# Patient Record
Sex: Female | Born: 2000 | Hispanic: No | Marital: Single | Smoking: Never smoker
Health system: Southern US, Community
[De-identification: ages and names within clinical notes are randomized; demographics above are authoritative.]

## PROBLEM LIST (undated history)

## (undated) DIAGNOSIS — Z8742 Personal history of other diseases of the female genital tract: Secondary | ICD-10-CM

## (undated) DIAGNOSIS — J45909 Unspecified asthma, uncomplicated: Secondary | ICD-10-CM

## (undated) HISTORY — PX: OTHER SURGICAL HISTORY: SHX169

## (undated) HISTORY — DX: Personal history of other diseases of the female genital tract: Z87.42

---
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
## 2021-01-01 ENCOUNTER — Other Ambulatory Visit: Payer: Self-pay | Admitting: Obstetrics & Gynecology

## 2021-03-07 ENCOUNTER — Emergency Department: Payer: Medicaid Other

## 2021-03-07 ENCOUNTER — Encounter: Payer: Self-pay | Admitting: Emergency Medicine

## 2021-03-07 ENCOUNTER — Other Ambulatory Visit: Payer: Self-pay

## 2021-03-07 ENCOUNTER — Emergency Department
Admission: EM | Admit: 2021-03-07 | Discharge: 2021-03-07 | Disposition: A | Discharge status: Home / Self Care | Payer: Medicaid Other | Attending: Emergency Medicine | Admitting: Emergency Medicine

## 2021-03-07 DIAGNOSIS — R0602 Shortness of breath: Secondary | ICD-10-CM | POA: Diagnosis present

## 2021-03-07 DIAGNOSIS — J45901 Unspecified asthma with (acute) exacerbation: Secondary | ICD-10-CM | POA: Diagnosis not present

## 2021-03-07 MED ORDER — PREDNISONE 20 MG PO TABS
60.0000 mg | ORAL_TABLET | Freq: Once | ORAL | Status: AC
  Administered 2021-03-07: 60 mg via ORAL
  Filled 2021-03-07: qty 3

## 2021-03-07 MED ORDER — IPRATROPIUM-ALBUTEROL 0.5-2.5 (3) MG/3ML IN SOLN
3.0000 mL | Freq: Once | RESPIRATORY_TRACT | Status: AC
  Administered 2021-03-07: 3 mL via RESPIRATORY_TRACT
  Filled 2021-03-07: qty 3

## 2021-03-07 MED ORDER — IPRATROPIUM-ALBUTEROL 0.5-2.5 (3) MG/3ML IN SOLN
6.0000 mL | Freq: Once | RESPIRATORY_TRACT | Status: AC
  Administered 2021-03-07: 6 mL via RESPIRATORY_TRACT

## 2021-03-07 MED ORDER — PREDNISONE 10 MG PO TABS: 10.0000 mg | ORAL_TABLET | Freq: Every day | ORAL | 0 refills | Status: DC

## 2021-03-07 MED ORDER — IPRATROPIUM-ALBUTEROL 0.5-2.5 (3) MG/3ML IN SOLN
RESPIRATORY_TRACT | Status: AC
  Filled 2021-03-07: qty 6

## 2021-03-07 NOTE — ED Notes (Signed)
Signature pad not working in room. Pt signed physical discharge form and ambulated to lobby.

## 2021-03-07 NOTE — ED Triage Notes (Signed)
Neb completed.  Respirations regular and non labored.  Voice clear and strong.  NAD

## 2021-03-07 NOTE — ED Triage Notes (Signed)
C/O wheezing and asthma attack x 3 days.  Using inhalers with no effect.

## 2021-03-07 NOTE — ED Provider Notes (Signed)
[facility] Emergency Department Provider Note  Time seen: 1:56 PM  I have reviewed the triage vital signs and the nursing notes.   HISTORY  Chief Complaint Asthma   HPI Ronni A Gassner is a 20 y.o. female with a past medical history of asthma who presents emergency department for shortness of breath.  According to the patient over the past 3 days she has been experiencing shortness of breath and wheeze.  Has been using her albuterol inhaler without relief so she came to the emergency department.  Here patient continues to have moderate expiratory wheeze.  States slight cough which she relates to asthma denies any fever or congestion.  Has a secondary complaint patient states she is approximately 1 month late on her menstrual cycle.   Past Medical History:  Diagnosis Date   Asthma     Patient Active Problem List   Diagnosis Date Noted   Encounter for supervision of normal first pregnancy in first trimester 05/22/2020   History of asthma 12/29/2018   Oral contraception initiation 12/29/2018    History reviewed. No pertinent surgical history.  Prior to Admission medications   Medication Sig Start Date End Date Taking? Authorizing Provider  albuterol (VENTOLIN HFA) 108 (90 Base) MCG/ACT inhaler Inhale 2 puffs into the lungs every 6 (six) hours as needed for wheezing or shortness of breath. 03/17/20   Willy Eddy, MD  albuterol (VENTOLIN HFA) 108 (90 Base) MCG/ACT inhaler Inhale 2 puffs into the lungs every 6 (six) hours as needed for wheezing or shortness of breath. 03/17/20   Willy Eddy, MD  budesonide-formoterol [facility]) 80-4.5 MCG/ACT inhaler Inhale 2 puffs into the lungs in the morning and at bedtime. 03/17/20 04/16/20  Willy Eddy, MD  cetirizine (ZYRTEC) 10 MG tablet Take 1 tablet (10 mg total) by mouth daily. 02/02/18   Loleta Rose, MD    Allergies  Allergen Reactions   Shellfish Allergy Swelling    No family history on  file.  Social History Social History   Tobacco Use   Smoking status: Never   Smokeless tobacco: Never  Vaping Use   Vaping Use: Never used  Substance Use Topics   Alcohol use: Not Currently    Comment: rarely   Drug use: Never    Review of Systems Constitutional: Negative for fever. Cardiovascular: Negative for chest pain. Respiratory: Positive for shortness of breath and wheeze.  Slight cough. Gastrointestinal: Negative for abdominal pain Genitourinary: Negative for urinary compaints Musculoskeletal: Negative for musculoskeletal complaints Neurological: Negative for headache All other ROS negative  ____________________________________________   PHYSICAL EXAM:  VITAL SIGNS: ED Triage Vitals  Enc Vitals Group     BP 03/07/21 1258 125/85     Pulse Rate 03/07/21 1258 (!) 130     Resp 03/07/21 1258 (!) 25     Temp 03/07/21 1258 98.2 F (36.8 C)     Temp Source 03/07/21 1258 Oral     SpO2 03/07/21 1258 94 %     Weight 03/07/21 1253 138 lb 0.1 oz (62.6 kg)     Height 03/07/21 1253 5\' 4"  (1.626 m)     Head Circumference --      Peak Flow --      Pain Score 03/07/21 1253 0     Pain Loc --      Pain Edu? --      Excl. in GC? --    Constitutional: Alert and oriented. Well appearing and in no distress. Eyes: Normal exam ENT  Head: Normocephalic and atraumatic.      Mouth/Throat: Mucous membranes are moist. Cardiovascular: Normal rate, regular rhythm.  Respiratory: Normal respiratory rate with moderate expiratory wheeze bilaterally. Gastrointestinal: Soft and nontender. No distention.  Musculoskeletal: Nontender with normal range of motion in all extremities.  Neurologic:  Normal speech and language. No gross focal neurologic deficits Skin:  Skin is warm, dry and intact.  Psychiatric: Mood and affect are normal.    INITIAL IMPRESSION / ASSESSMENT AND PLAN / ED COURSE  Pertinent labs & imaging results that were available during my care of the patient were  reviewed by me and considered in my medical decision making (see chart for details).   Patient presents emergency department for asthma exacerbation over the past 3 days with moderate expiratory wheeze bilaterally.  No fever.  Patient appears well she is able to speak in full sentences.  We will dose several DuoNeb's, oral prednisone and reassess.  Patient agreeable to plan of care.  Patient's chest x-ray is consistent with reactive airway disease.  Patient's wheeze is much improved after duo nebs.  Patient states she is feeling much better and is ready to go home.  On repeat examination patient does have a slight expiratory wheeze.  Patient states he feels comfortable going home and if the shortness of breath worsens she will return to the emergency department.  Patient has albuterol to use at home.  We will discharge with a prednisone taper and have her follow-up with her doctor.  Magon A Kathan was evaluated in Emergency Department on 03/07/2021 for the symptoms described in the history of present illness. She was evaluated in the context of the global COVID-19 pandemic, which necessitated consideration that the patient might be at risk for infection with the SARS-CoV-2 virus that causes COVID-19. Institutional protocols and algorithms that pertain to the evaluation of patients at risk for COVID-19 are in a state of rapid change based on information released by regulatory bodies including the CDC and federal and state organizations. These policies and algorithms were followed during the patient's care in the ED.  ____________________________________________   FINAL CLINICAL IMPRESSION(S) / ED DIAGNOSES  Asthma exacerbation   Minna Antis, MD 03/07/21 1520

## 2021-04-29 ENCOUNTER — Other Ambulatory Visit: Payer: Self-pay

## 2021-04-29 ENCOUNTER — Encounter: Payer: Self-pay | Admitting: Emergency Medicine

## 2021-04-29 ENCOUNTER — Emergency Department
Admission: EM | Admit: 2021-04-29 | Discharge: 2021-04-29 | Disposition: A | Discharge status: Home / Self Care | Payer: Medicaid Other | Attending: Emergency Medicine | Admitting: Emergency Medicine

## 2021-04-29 DIAGNOSIS — Z20822 Contact with and (suspected) exposure to covid-19: Secondary | ICD-10-CM | POA: Diagnosis not present

## 2021-04-29 DIAGNOSIS — Z7951 Long term (current) use of inhaled steroids: Secondary | ICD-10-CM | POA: Insufficient documentation

## 2021-04-29 DIAGNOSIS — J4541 Moderate persistent asthma with (acute) exacerbation: Secondary | ICD-10-CM | POA: Diagnosis not present

## 2021-04-29 DIAGNOSIS — R062 Wheezing: Secondary | ICD-10-CM | POA: Diagnosis present

## 2021-04-29 LAB — RESP PANEL BY RT-PCR (FLU A&B, COVID) ARPGX2
Influenza A by PCR: NEGATIVE
Influenza B by PCR: NEGATIVE
SARS Coronavirus 2 by RT PCR: NEGATIVE

## 2021-04-29 MED ORDER — PREDNISONE 10 MG (21) PO TBPK: ORAL_TABLET | ORAL | 0 refills | Status: DC

## 2021-04-29 MED ORDER — IPRATROPIUM-ALBUTEROL 0.5-2.5 (3) MG/3ML IN SOLN
3.0000 mL | Freq: Once | RESPIRATORY_TRACT | Status: AC
  Administered 2021-04-29: 3 mL via RESPIRATORY_TRACT

## 2021-04-29 MED ORDER — PREDNISONE 20 MG PO TABS
60.0000 mg | ORAL_TABLET | Freq: Once | ORAL | Status: AC
  Administered 2021-04-29: 60 mg via ORAL

## 2021-04-29 MED ORDER — ALBUTEROL SULFATE HFA 108 (90 BASE) MCG/ACT IN AERS: 2.0000 | INHALATION_SPRAY | Freq: Four times a day (QID) | RESPIRATORY_TRACT | 2 refills | Status: DC | PRN

## 2021-04-29 MED ORDER — IPRATROPIUM-ALBUTEROL 0.5-2.5 (3) MG/3ML IN SOLN
6.0000 mL | Freq: Once | RESPIRATORY_TRACT | Status: AC
  Administered 2021-04-29: 6 mL via RESPIRATORY_TRACT

## 2021-04-29 NOTE — ED Triage Notes (Signed)
Pt arrived via POV with reports of asthma, pt states she ran out of her nebs yesterday and does not have any refills on rescue inhaler, audible wheezes heard in triage.  Pt reports cough prior to ashtma sxs. Denies any fever, no known sick contacts.

## 2021-04-29 NOTE — ED Provider Notes (Signed)
[facility]-EMERGENCY DEPARTMENT  ____________________________________________  Time seen: Approximately 10:53 PM  I have reviewed the triage vital signs and the nursing notes.   HISTORY  Chief Complaint Asthma   Historian Patient     HPI Laura Holder is a 20 y.o. female presents to the emergency department with wheezing.  Patient states that she has been out of her inhaler at home.  She has had some chest tightness.  Denies fever and chills.  No nausea, vomiting or abdominal pain.   Past Medical History:  Diagnosis Date   Asthma      Immunizations up to date:  Yes.     Past Medical History:  Diagnosis Date   Asthma     Patient Active Problem List   Diagnosis Date Noted   Encounter for supervision of normal first pregnancy in first trimester 05/22/2020   History of asthma 12/29/2018   Oral contraception initiation 12/29/2018    History reviewed. No pertinent surgical history.  Prior to Admission medications   Medication Sig Start Date End Date Taking? Authorizing Provider  predniSONE (STERAPRED UNI-PAK 21 TAB) 10 MG (21) TBPK tablet 6,5,4,3,2,1 04/29/21  Yes Joseph Art, Lockie Pares M, PA-C  albuterol (VENTOLIN HFA) 108 (90 Base) MCG/ACT inhaler Inhale 2 puffs into the lungs every 6 (six) hours as needed for wheezing or shortness of breath. 03/17/20   Willy Eddy, MD  albuterol (VENTOLIN HFA) 108 (90 Base) MCG/ACT inhaler Inhale 2 puffs into the lungs every 6 (six) hours as needed for wheezing or shortness of breath. 03/17/20   Willy Eddy, MD  budesonide-formoterol [facility]) 80-4.5 MCG/ACT inhaler Inhale 2 puffs into the lungs in the morning and at bedtime. 03/17/20 04/16/20  Willy Eddy, MD  cetirizine (ZYRTEC) 10 MG tablet Take 1 tablet (10 mg total) by mouth daily. 02/02/18   Loleta Rose, MD    Allergies Shellfish allergy  History reviewed. No pertinent family history.  Social History Social History   Tobacco Use   Smoking status: Never    Smokeless tobacco: Never  Vaping Use   Vaping Use: Never used  Substance Use Topics   Alcohol use: Not Currently    Comment: rarely   Drug use: Never     Review of Systems  Constitutional: No fever/chills Eyes:  No discharge ENT: No upper respiratory complaints. Respiratory: Patient has wheezing.  Gastrointestinal:   No nausea, no vomiting.  No diarrhea.  No constipation. Musculoskeletal: Negative for musculoskeletal pain. Skin: Negative for rash, abrasions, lacerations, ecchymosis.   ____________________________________________   PHYSICAL EXAM:  VITAL SIGNS: ED Triage Vitals  Enc Vitals Group     BP 04/29/21 1945 (!) 134/95     Pulse Rate 04/29/21 1945 (!) 101     Resp 04/29/21 1945 18     Temp 04/29/21 1945 98.9 F (37.2 C)     Temp Source 04/29/21 1945 Oral     SpO2 04/29/21 1945 92 %     Weight 04/29/21 1944 132 lb (59.9 kg)     Height 04/29/21 1944 5\' 5"  (1.651 m)     Head Circumference --      Peak Flow --      Pain Score 04/29/21 1944 0     Pain Loc --      Pain Edu? --      Excl. in GC? --      Constitutional: Alert and oriented. Well appearing and in no acute distress. Eyes: Conjunctivae are normal. PERRL. EOMI. Head: Atraumatic. ENT:      Nose:  No congestion/rhinnorhea.      Mouth/Throat: Mucous membranes are moist.  Neck: No stridor.  No cervical spine tenderness to palpation. Cardiovascular: Normal rate, regular rhythm. Normal S1 and S2.  Good peripheral circulation. Respiratory: Normal respiratory effort without tachypnea or retractions.  Patient has diffuse expiratory wheezing auscultated bilaterally.  Good air entry to the bases with no decreased or absent breath sounds Gastrointestinal: Bowel sounds x 4 quadrants. Soft and nontender to palpation. No guarding or rigidity. No distention. Musculoskeletal: Full range of motion to all extremities. No obvious deformities noted Neurologic:  Normal for age. No gross focal neurologic deficits are  appreciated.  Skin:  Skin is warm, dry and intact. No rash noted. Psychiatric: Mood and affect are normal for age. Speech and behavior are normal.   ____________________________________________   LABS (all labs ordered are listed, but only abnormal results are displayed)  Labs Reviewed  RESP PANEL BY RT-PCR (FLU A&B, COVID) [id number]   ____________________________________________  EKG   ____________________________________________  RADIOLOGY   No results found.  ____________________________________________    PROCEDURES  Procedure(s) performed:     Procedures     Medications  ipratropium-albuterol (DUONEB) 0.5-2.5 (3) MG/3ML nebulizer solution 6 mL (6 mLs Nebulization Given 04/29/21 1957)  ipratropium-albuterol (DUONEB) 0.5-2.5 (3) MG/3ML nebulizer solution 3 mL (3 mLs Nebulization Given 04/29/21 2201)  ipratropium-albuterol (DUONEB) 0.5-2.5 (3) MG/3ML nebulizer solution 3 mL (3 mLs Nebulization Given 04/29/21 2249)  predniSONE (DELTASONE) tablet 60 mg (60 mg Oral Given 04/29/21 2247)     ____________________________________________   INITIAL IMPRESSION / ASSESSMENT AND PLAN / ED COURSE  Pertinent labs & imaging results that were available during my care of the patient were reviewed by me and considered in my medical decision making (see chart for details).      Assessment and plan Wheezing 20 year old female presents to the emergency department with wheezing and chest tightness for the past 24 hours.  Vital signs are reassuring at triage.  On physical exam, patient was alert, active and nontoxic-appearing with diffuse wheezing auscultated bilaterally.  Wheezing did improve with DuoNeb.  She was started on first dose of prednisone in the emergency department and discharged with tapered prednisone.  Return precautions were given to return with new or worsening symptoms.  All patient questions were  answered.     ____________________________________________  FINAL CLINICAL IMPRESSION(S) / ED DIAGNOSES  Final diagnoses:  Moderate persistent asthma with exacerbation      NEW MEDICATIONS STARTED DURING THIS VISIT:  ED Discharge Orders          Ordered    predniSONE (STERAPRED UNI-PAK 21 TAB) 10 MG (21) TBPK tablet        04/29/21 2239                This chart was dictated using voice recognition software/Dragon. Despite best efforts to proofread, errors can occur which can change the meaning. Any change was purely unintentional.     Orvil Feil, PA-C 04/29/21 2256    Delton Prairie, MD 05/02/21 [phone]

## 2021-04-29 NOTE — ED Notes (Signed)
Pt improved after neb

## 2021-08-09 ENCOUNTER — Emergency Department: Payer: No Typology Code available for payment source

## 2021-08-09 ENCOUNTER — Other Ambulatory Visit: Payer: Self-pay

## 2021-08-09 ENCOUNTER — Emergency Department
Admission: EM | Admit: 2021-08-09 | Discharge: 2021-08-09 | Disposition: A | Discharge status: Home / Self Care | Payer: No Typology Code available for payment source | Attending: Emergency Medicine | Admitting: Emergency Medicine

## 2021-08-09 DIAGNOSIS — Y92410 Unspecified street and highway as the place of occurrence of the external cause: Secondary | ICD-10-CM | POA: Diagnosis not present

## 2021-08-09 DIAGNOSIS — S0003XA Contusion of scalp, initial encounter: Secondary | ICD-10-CM | POA: Diagnosis not present

## 2021-08-09 DIAGNOSIS — M542 Cervicalgia: Secondary | ICD-10-CM | POA: Insufficient documentation

## 2021-08-09 DIAGNOSIS — S0990XA Unspecified injury of head, initial encounter: Secondary | ICD-10-CM | POA: Diagnosis present

## 2021-08-09 LAB — POC URINE PREG, ED: Preg Test, Ur: NEGATIVE

## 2021-08-09 MED ORDER — METHOCARBAMOL 500 MG PO TABS: 500.0000 mg | ORAL_TABLET | Freq: Four times a day (QID) | ORAL | 0 refills | Status: DC

## 2021-08-09 MED ORDER — MELOXICAM 15 MG PO TABS: 15.0000 mg | ORAL_TABLET | Freq: Every day | ORAL | 0 refills | Status: DC
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
## 2021-11-01 ENCOUNTER — Encounter: Payer: Self-pay | Admitting: Internal Medicine

## 2021-11-01 ENCOUNTER — Other Ambulatory Visit: Payer: Self-pay

## 2021-11-01 ENCOUNTER — Observation Stay
Admission: EM | Admit: 2021-11-01 | Discharge: 2021-11-02 | Disposition: A | Discharge status: Home / Self Care | Payer: Medicaid Other | Attending: Student | Admitting: Student

## 2021-11-01 ENCOUNTER — Emergency Department: Payer: Medicaid Other

## 2021-11-01 DIAGNOSIS — J45901 Unspecified asthma with (acute) exacerbation: Principal | ICD-10-CM | POA: Insufficient documentation

## 2021-11-01 DIAGNOSIS — R0602 Shortness of breath: Secondary | ICD-10-CM | POA: Diagnosis present

## 2021-11-01 DIAGNOSIS — J4551 Severe persistent asthma with (acute) exacerbation: Secondary | ICD-10-CM

## 2021-11-01 DIAGNOSIS — Z20822 Contact with and (suspected) exposure to covid-19: Secondary | ICD-10-CM | POA: Diagnosis not present

## 2021-11-01 DIAGNOSIS — J9601 Acute respiratory failure with hypoxia: Secondary | ICD-10-CM | POA: Diagnosis not present

## 2021-11-01 DIAGNOSIS — Z79899 Other long term (current) drug therapy: Secondary | ICD-10-CM | POA: Diagnosis not present

## 2021-11-01 DIAGNOSIS — R000 Tachycardia, unspecified: Secondary | ICD-10-CM | POA: Insufficient documentation

## 2021-11-01 LAB — CBC WITH DIFFERENTIAL/PLATELET
Abs Immature Granulocytes: 0.02 10*3/uL (ref 0.00–0.07)
Basophils Absolute: 0.1 10*3/uL (ref 0.0–0.1)
Basophils Relative: 1 %
Eosinophils Absolute: 0.8 10*3/uL — ABNORMAL HIGH (ref 0.0–0.5)
Eosinophils Relative: 9 %
HCT: 48.7 % — ABNORMAL HIGH (ref 36.0–46.0)
Hemoglobin: 15.8 g/dL — ABNORMAL HIGH (ref 12.0–15.0)
Immature Granulocytes: 0 %
Lymphocytes Relative: 36 %
Lymphs Abs: 3.3 10*3/uL (ref 0.7–4.0)
MCH: 28.3 pg (ref 26.0–34.0)
MCHC: 32.4 g/dL (ref 30.0–36.0)
MCV: 87.3 fL (ref 80.0–100.0)
Monocytes Absolute: 0.9 10*3/uL (ref 0.1–1.0)
Monocytes Relative: 10 %
Neutro Abs: 4.2 10*3/uL (ref 1.7–7.7)
Neutrophils Relative %: 44 %
Platelets: 398 10*3/uL (ref 150–400)
RBC: 5.58 MIL/uL — ABNORMAL HIGH (ref 3.87–5.11)
RDW: 13.5 % (ref 11.5–15.5)
WBC: 9.3 10*3/uL (ref 4.0–10.5)
nRBC: 0 % (ref 0.0–0.2)

## 2021-11-01 LAB — BASIC METABOLIC PANEL
Anion gap: 8 (ref 5–15)
BUN: 7 mg/dL (ref 6–20)
CO2: 23 mmol/L (ref 22–32)
Calcium: 9.2 mg/dL (ref 8.9–10.3)
Chloride: 108 mmol/L (ref 98–111)
Creatinine, Ser: 0.65 mg/dL (ref 0.44–1.00)
GFR, Estimated: >60 mL/min (ref >60)
Glucose, Bld: 103 mg/dL — ABNORMAL HIGH (ref 70–99)
Potassium: 3.6 mmol/L (ref 3.5–5.1)
Sodium: 139 mmol/L (ref 135–145)

## 2021-11-01 LAB — SARS CORONAVIRUS 2 BY RT PCR: SARS Coronavirus 2 by RT PCR: NEGATIVE

## 2021-11-01 LAB — TROPONIN I (HIGH SENSITIVITY): Troponin I (High Sensitivity): <2 ng/L (ref <18)

## 2021-11-01 MED ORDER — SODIUM CHLORIDE 0.9 % IV BOLUS
1500.0000 mL | Freq: Once | INTRAVENOUS | Status: AC
  Administered 2021-11-01: 1500 mL via INTRAVENOUS

## 2021-11-01 MED ORDER — ONDANSETRON HCL 4 MG/2ML IJ SOLN: 4.0000 mg | Freq: Three times a day (TID) | INTRAMUSCULAR | Status: DC | PRN

## 2021-11-01 MED ORDER — MAGNESIUM SULFATE 2 GM/50ML IV SOLN
INTRAVENOUS | Status: AC
  Administered 2021-11-01: 2 g
  Filled 2021-11-01: qty 50

## 2021-11-01 MED ORDER — DM-GUAIFENESIN ER 30-600 MG PO TB12: 1.0000 | ORAL_TABLET | Freq: Two times a day (BID) | ORAL | Status: DC | PRN

## 2021-11-01 MED ORDER — IPRATROPIUM-ALBUTEROL 0.5-2.5 (3) MG/3ML IN SOLN: 9.0000 mL | Freq: Once | RESPIRATORY_TRACT | Status: AC

## 2021-11-01 MED ORDER — ORAL CARE MOUTH RINSE: 15.0000 mL | Freq: Two times a day (BID) | OROMUCOSAL | Status: DC

## 2021-11-01 MED ORDER — ENOXAPARIN SODIUM 40 MG/0.4ML IJ SOSY
40.0000 mg | PREFILLED_SYRINGE | INTRAMUSCULAR | Status: DC
  Administered 2021-11-01: 40 mg via SUBCUTANEOUS
  Filled 2021-11-01: qty 0.4

## 2021-11-01 MED ORDER — IPRATROPIUM-ALBUTEROL 0.5-2.5 (3) MG/3ML IN SOLN
RESPIRATORY_TRACT | Status: AC
  Administered 2021-11-01: 9 mL via RESPIRATORY_TRACT
  Filled 2021-11-01: qty 9

## 2021-11-01 MED ORDER — IPRATROPIUM BROMIDE HFA 17 MCG/ACT IN AERS
2.0000 | INHALATION_SPRAY | RESPIRATORY_TRACT | Status: DC
Start: 2021-11-01 — End: 2021-11-02
  Administered 2021-11-01 (×4): 2 via RESPIRATORY_TRACT
  Filled 2021-11-01: qty 12.9

## 2021-11-01 MED ORDER — METHYLPREDNISOLONE SODIUM SUCC 125 MG IJ SOLR
80.0000 mg | INTRAMUSCULAR | Status: DC
  Administered 2021-11-01: 80 mg via INTRAVENOUS
  Filled 2021-11-01: qty 2

## 2021-11-01 MED ORDER — ACETAMINOPHEN 325 MG PO TABS: 650.0000 mg | ORAL_TABLET | Freq: Four times a day (QID) | ORAL | Status: DC | PRN

## 2021-11-01 MED ORDER — METHYLPREDNISOLONE SODIUM SUCC 125 MG IJ SOLR
INTRAMUSCULAR | Status: AC
  Administered 2021-11-01: 125 mg
  Filled 2021-11-01: qty 2

## 2021-11-01 MED ORDER — LEVALBUTEROL HCL 0.63 MG/3ML IN NEBU
0.6300 mg | INHALATION_SOLUTION | Freq: Four times a day (QID) | RESPIRATORY_TRACT | Status: DC | PRN
  Administered 2021-11-01: 0.63 mg via RESPIRATORY_TRACT
  Filled 2021-11-01: qty 3

## 2021-11-01 NOTE — ED Notes (Signed)
X-ray at bedside

## 2021-11-01 NOTE — Assessment & Plan Note (Deleted)
Acute respiratory failure with hypoxia due to asthma exacerbation: COVID PCR negative.  - will admit to progressive unit as inpatient -Bronchodilators -Patient received 2 g of magnesium sulfate in ED -Solu-Medrol 40 mg IV bid -Mucinex for cough  -check RVP -Nasal cannula oxygen as needed to maintain O2 saturation 93% or greater

## 2021-11-01 NOTE — Assessment & Plan Note (Addendum)
Desaturated to 89% on room air.  Respiratory distress improved. -Discharged on prednisone, Advair and as needed albuterol. -Full RVP panel pending

## 2021-11-01 NOTE — ED Notes (Signed)
ED Provider at bedside. 

## 2021-11-01 NOTE — ED Provider Notes (Signed)
[facility] Provider Note    Event Date/Time   First MD Initiated Contact with Patient 11/01/21 [phone]     (approximate)   History   Chief Complaint Shortness of Breath   HPI  Laura Holder is a 21 y.o. female with past medical history of asthma who presents to the ED complaining of shortness of breath.  Patient reports that she has been increasingly short of breath with a dry cough past 2 days.  She describes associated tightness and pain in her chest moving towards her back.  She has been using her inhaler at home with only partial relief.  She states her cough is nonproductive and she has not had any fevers.  She denies any pain or swelling in her legs.  She describes symptoms as similar to prior asthma exacerbations and she has required admission to the hospital before for asthma but has never been intubated.     Physical Exam   Triage Vital Signs: ED Triage Vitals  Enc Vitals Group     BP 11/01/21 0558 (!) 141/100     Pulse Rate 11/01/21 0558 (!) 152     Resp 11/01/21 0558 16     Temp 11/01/21 0558 99 F (37.2 C)     Temp Source 11/01/21 0558 Oral     SpO2 11/01/21 0558 92 %     Weight 11/01/21 0556 132 lb (59.9 kg)     Height 11/01/21 0556 5\' 6"  (1.676 m)     Head Circumference --      Peak Flow --      Pain Score 11/01/21 0555 5     Pain Loc --      Pain Edu? --      Excl. in GC? --     Most recent vital signs: Vitals:   11/01/21 0700 11/01/21 0718  BP: 129/81   Pulse: (!) 134 (!) 129  Resp: (!) 23   Temp:    SpO2: (!) 89% 90%    Constitutional: Alert and oriented. Eyes: Conjunctivae are normal. Head: Atraumatic. Nose: No congestion/rhinnorhea. Mouth/Throat: Mucous membranes are moist.  Cardiovascular: Tachycardic, regular rhythm. Grossly normal heart sounds.  2+ radial pulses bilaterally. Respiratory: Tachypneic with increased respiratory effort, inspiratory and expiratory wheezing noted throughout. Gastrointestinal: Soft and  nontender. No distention. Musculoskeletal: No lower extremity tenderness nor edema.  Neurologic:  Normal speech and language. No gross focal neurologic deficits are appreciated.    ED Results / Procedures / Treatments   Labs (all labs ordered are listed, but only abnormal results are displayed) Labs Reviewed  CBC WITH DIFFERENTIAL/PLATELET - Abnormal; Notable for the following components:      Result Value   RBC 5.58 (*)    Hemoglobin 15.8 (*)    HCT 48.7 (*)    Eosinophils Absolute 0.8 (*)    All other components within normal limits  BASIC METABOLIC PANEL - Abnormal; Notable for the following components:   Glucose, Bld 103 (*)    All other components within normal limits  SARS CORONAVIRUS 2 BY RT PCR  POC URINE PREG, ED  TROPONIN I (HIGH SENSITIVITY)     EKG  ED ECG REPORT I, 01/01/22, the attending physician, personally viewed and interpreted this ECG.   Date: 11/01/2021  EKG Time: 6:03  Rate: 149  Rhythm: sinus tachycardia  Axis: RAD  Intervals:none  ST&T Change: None  RADIOLOGY Chest x-ray reviewed and interpreted by me with no infiltrate, edema, or effusion.  PROCEDURES:  Critical Care performed: No  Procedures   MEDICATIONS ORDERED IN ED: Medications  methylPREDNISolone sodium succinate (SOLU-MEDROL) 125 mg/2 mL injection (125 mg  Given 11/01/21 0947)  magnesium sulfate 2 GM/50ML IVPB (0 g  Stopped 11/01/21 0707)  ipratropium-albuterol (DUONEB) 0.5-2.5 (3) MG/3ML nebulizer solution 9 mL (9 mLs Nebulization Given 11/01/21 0604)     IMPRESSION / MDM / ASSESSMENT AND PLAN / ED COURSE  I reviewed the triage vital signs and the nursing notes.                              21 y.o. female with past medical history of asthma who presents to the ED complaining of dry cough, shortness of breath, and chest tightness increasing over the past 2 days.  Patient's presentation is most consistent with acute presentation with potential threat to life or bodily  function.  Differential diagnosis includes, but is not limited to, asthma exacerbation, ACS, PE, pneumonia, pneumothorax.  Patient with significant difficulty breathing upon arrival, noted to be tachycardic and tachypneic with increased respiratory effort.  She is maintaining oxygen saturations around 92% on room air, but has severe wheezing on exam.  She was given 3 DuoNebs back-to-back along with IV Solu-Medrol and IV magnesium.  EKG shows sinus tachycardia with no ischemic changes, chest x-ray is unremarkable.  Labs are reassuring with CBC showing no anemia or leukocytosis, BMP without electrolyte abnormality or AKI, troponin within normal limits.  No reason to suspect ACS or PE at this time.  Patient continues to have significant wheezing with ongoing tachycardia and tachypnea despite treatment for asthma.  Case discussed with hospitalist for admission for further treatment of asthma.      FINAL CLINICAL IMPRESSION(S) / ED DIAGNOSES   Final diagnoses:  Exacerbation of asthma, unspecified asthma severity, unspecified whether persistent     Rx / DC Orders   ED Discharge Orders     None        Note:  This document was prepared using Dragon voice recognition software and may include unintentional dictation errors.   Chesley Noon, MD 11/01/21 [phone]

## 2021-11-01 NOTE — H&P (Signed)
History and Physical    Laura Holder [id number] DOB: [birth year] DOA: 11/01/2021  Referring MD/NP/PA:   PCP: [facility]   Patient coming from:  The patient is coming from home.  At baseline, pt is independent for most of ADL.        Chief Complaint: Shortness of breath  HPI: Laura Holder is a 21 y.o. female with medical history significant of asthma, who presents with shortness breath.  Patient states that she has a shortness of breath for more than 2 days, which has been progressively worsening since this morning.  Patient has cough with little mucus production. no fever or chills.  She has chest tightness on the left side of the chest, pressure-like, constant, nonradiating.  Denies nausea vomiting, diarrhea or abdominal pain.  No symptoms of UTI.  Patient states that she used the inhaler at home without improvement.  Patient was found to have acute respiratory distress, using accessory muscle for breathing.  Oxygen desaturation to 89% on room air.  Patient is not using oxygen normally.  Data Reviewed and ED Course: pt was found to have WBC 9.3, troponin level less than 2, negative COVID PCR, GFR >60, pending pregnancy test, temperature 99, blood pressure 129/81, heart rate 152 --> 120s, RR 23, oxygen saturation 89% on room air.  Chest x-ray negative.  Patient is admitted to PCU as inpatient.   EKG: I have personally reviewed.  Sinus rhythm, tachycardia, QTc 400, RAD, poor R wave progression   Review of Systems:   General: no fevers, chills, no body weight gain, has fatigue HEENT: no blurry vision, hearing changes or sore throat Respiratory:  has dyspnea, coughing, wheezing CV: Has chest pressure.  No palpitations GI: no nausea, vomiting, abdominal pain, diarrhea, constipation GU: no dysuria, burning on urination, increased urinary frequency, hematuria  Ext: no leg edema Neuro: no unilateral weakness, numbness, or tingling, no vision change or hearing  loss Skin: no rash, no skin tear. MSK: No muscle spasm, no deformity, no limitation of range of movement in spin Heme: No easy bruising.  Travel history: No recent long distant travel.   Allergy:  Allergies  Allergen Reactions   Shellfish Allergy Swelling    Past Medical History:  Diagnosis Date   Asthma     Past Surgical History:  Procedure Laterality Date   Dental procedure      Social History:  reports that she has never smoked. She has never used smokeless tobacco. She reports that she does not currently use alcohol. She reports that she does not use drugs.  Family History:  Family History  Problem Relation Age of Onset   Asthma Mother    Asthma Father      Prior to Admission medications   Medication Sig Start Date End Date Taking? Authorizing Provider  albuterol (PROVENTIL) (2.5 MG/3ML) 0.083% nebulizer solution SMARTSIG:1 Vial(s) Via Nebulizer Every 4-6 Hours PRN 05/02/21   [provider]  albuterol (VENTOLIN HFA) 108 (90 Base) MCG/ACT inhaler Inhale 2 puffs into the lungs every 6 (six) hours as needed for wheezing or shortness of breath. 04/29/21   Lannie Fields, PA-C  budesonide-formoterol (SYMBICORT) 80-4.5 MCG/ACT inhaler Inhale 2 puffs into the lungs in the morning and at bedtime. 03/17/20 04/16/20  Merlyn Lot, MD  cetirizine (ZYRTEC) 10 MG tablet Take 1 tablet (10 mg total) by mouth daily. Patient not taking: Reported on 08/09/2021 02/02/18   Hinda Kehr, MD  FLOVENT HFA 220 MCG/ACT inhaler Inhale 2 puffs  into the lungs 2 (two) times daily. 05/02/21   [provider]  meloxicam (MOBIC) 15 MG tablet Take 1 tablet (15 mg total) by mouth daily. 08/09/21 08/09/22  Cuthriell, Charline Bills, PA-C  methocarbamol (ROBAXIN) 500 MG tablet Take 1 tablet (500 mg total) by mouth 4 (four) times daily. 08/09/21   Cuthriell, Charline Bills, PA-C  predniSONE (STERAPRED UNI-PAK 21 TAB) 10 MG (21) TBPK tablet 6,5,4,3,2,1 Patient not taking: Reported on 08/09/2021  04/29/21   Lannie Fields, [place]    Physical Exam: Vitals:   11/01/21 1530 11/01/21 1545 11/01/21 1552 11/01/21 1600  BP:   112/67 118/78  Pulse: (!) 106 (!) 106  (!) 125  Resp: 19 (!) 23  20  Temp:    98.3 F (36.8 C)  TempSrc:    Oral  SpO2: 92% 95%  93%  Weight:      Height:       General: Has acute respiratory distress. HEENT:       Eyes: PERRL, EOMI, no scleral icterus.       ENT: No discharge from the ears and nose, no pharynx injection, no tonsillar enlargement.        Neck: No JVD, no bruit, no mass felt. Heme: No neck lymph node enlargement. Cardiac: S1/S2, RRR, No murmurs, No gallops or rubs. Respiratory: Has wheezing bilaterally GI: Soft, nondistended, nontender, no rebound pain, no organomegaly, BS present. GU: No hematuria Ext: No pitting leg edema bilaterally. 1+DP/PT pulse bilaterally. Musculoskeletal: No joint deformities, No joint redness or warmth, no limitation of ROM in spin. Skin: No rashes.  Neuro: Alert, oriented X3, cranial nerves II-XII grossly intact, moves all extremities normally. Psych: Patient is not psychotic, no suicidal or hemocidal ideation.  Labs on Admission: I have personally reviewed following labs and imaging studies  CBC: Recent Labs  Lab 11/01/21 0558  WBC 9.3  NEUTROABS 4.2  HGB 15.8*  HCT 48.7*  MCV 87.3  PLT [id number]   Basic Metabolic Panel: Recent Labs  Lab 11/01/21 0558  NA 139  K 3.6  CL 108  CO2 23  GLUCOSE 103*  BUN 7  CREATININE 0.65  CALCIUM 9.2   GFR: Estimated Creatinine Clearance: 105 mL/min (by C-G formula based on SCr of 0.65 mg/dL). Liver Function Tests: No results for input(s): AST, ALT, ALKPHOS, BILITOT, PROT, ALBUMIN in the last 168 hours. No results for input(s): LIPASE, AMYLASE in the last 168 hours. No results for input(s): AMMONIA in the last 168 hours. Coagulation Profile: No results for input(s): INR, PROTIME in the last 168 hours. Cardiac Enzymes: No results for input(s): CKTOTAL, CKMB,  CKMBINDEX, TROPONINI in the last 168 hours. BNP (last 3 results) No results for input(s): PROBNP in the last 8760 hours. HbA1C: No results for input(s): HGBA1C in the last 72 hours. CBG: No results for input(s): GLUCAP in the last 168 hours. Lipid Profile: No results for input(s): CHOL, HDL, LDLCALC, TRIG, CHOLHDL, LDLDIRECT in the last 72 hours. Thyroid Function Tests: No results for input(s): TSH, T4TOTAL, FREET4, T3FREE, THYROIDAB in the last 72 hours. Anemia Panel: No results for input(s): VITAMINB12, FOLATE, FERRITIN, TIBC, IRON, RETICCTPCT in the last 72 hours. Urine analysis:    Component Value Date/Time   COLORURINE STRAW (A) 08/22/2020 1527   APPEARANCEUR CLEAR (A) 08/22/2020 1527   LABSPEC 1.005 08/22/2020 1527   PHURINE 7.0 08/22/2020 1527   GLUCOSEU NEGATIVE 08/22/2020 1527   HGBUR NEGATIVE 08/22/2020 1527   BILIRUBINUR NEGATIVE 08/22/2020 1527   [place] 08/22/2020 1527  PROTEINUR NEGATIVE 08/22/2020 1527   NITRITE NEGATIVE 08/22/2020 1527   LEUKOCYTESUR TRACE (A) 08/22/2020 1527   Sepsis Labs: @LABRCNTIP (procalcitonin:4,lacticidven:4) ) Recent Results (from the past 240 hour(s))  SARS Coronavirus 2 by RT PCR (hospital order, performed in [facility] hospital lab) *cepheid single result test* Anterior Nasal Swab     Status: None   Collection Time: 11/01/21  6:07 AM   Specimen: Anterior Nasal Swab  Result Value Ref Range Status   SARS Coronavirus 2 by RT PCR NEGATIVE NEGATIVE Final    Comment: (NOTE) SARS-CoV-2 target nucleic acids are NOT DETECTED.  The SARS-CoV-2 RNA is generally detectable in upper and lower respiratory specimens during the acute phase of infection. The lowest concentration of SARS-CoV-2 viral copies this assay can detect is 250 copies / mL. A negative result does not preclude SARS-CoV-2 infection and should not be used as the sole basis for treatment or other patient management decisions.  A negative result may occur  with improper specimen collection / handling, submission of specimen other than nasopharyngeal swab, presence of viral mutation(s) within the areas targeted by this assay, and inadequate number of viral copies (<250 copies / mL). A negative result must be combined with clinical observations, patient history, and epidemiological information.  Fact Sheet for Patients:   https://www.patel.info/  Fact Sheet for Healthcare Providers: https://hall.com/  This test is not yet approved or  cleared by the [place] FDA and has been authorized for detection and/or diagnosis of SARS-CoV-2 by FDA under an Emergency Use Authorization (EUA).  This EUA will remain in effect (meaning this test can be used) for the duration of the COVID-19 declaration under Section 564(b)(1) of the Act, 21 U.S.C. section 360bbb-3(b)(1), unless the authorization is terminated or revoked sooner.  Performed at [facility], [address] [place], [place] [address]      Radiological Exams on Admission: DG Chest Portable 1 View  Result Date: 11/01/2021 CLINICAL DATA:  21 year old female with history of shortness of breath. EXAM: PORTABLE CHEST 1 VIEW COMPARISON:  Chest x-ray 03/07/2021. FINDINGS: Lung volumes are normal. No consolidative airspace disease. No pleural effusions. No pneumothorax. No pulmonary nodule or mass noted. Pulmonary vasculature and the cardiomediastinal silhouette are within normal limits. IMPRESSION: No radiographic evidence of acute cardiopulmonary disease. Electronically Signed   By: Vinnie Langton M.D.   On: 11/01/2021 06:25      Assessment/Plan Principal Problem:   Asthma exacerbation Active Problems:   Acute respiratory failure with hypoxia (HCC)   Principal Problem:   Asthma exacerbation Active Problems:   Acute respiratory failure with hypoxia (HCC)   Assessment and Plan: * Asthma exacerbation Acute respiratory failure  with hypoxia due to asthma exacerbation: COVID PCR negative.  - will admit to progressive unit as inpatient -Bronchodilators -Patient received 2 g of magnesium sulfate in ED -Solu-Medrol 40 mg IV bid -Mucinex for cough  -check RVP -Nasal cannula oxygen as needed to maintain O2 saturation 93% or greater  Acute respiratory failure with hypoxia (HCC) -see above             DVT ppx:  SQ Lovenox  Code Status: Full code  Family Communication:   Yes, patient's boyfriend   at bed side.     Disposition Plan:  Anticipate discharge back to previous environment  Consults called: None  Admission status and Level of care: Progressive:    as inpt       Severity of Illness:  The appropriate patient status for this patient is  INPATIENT. Inpatient status is judged to be reasonable and necessary in order to provide the required intensity of service to ensure the patient's safety. The patient's presenting symptoms, physical exam findings, and initial radiographic and laboratory data in the context of their chronic comorbidities is felt to place them at high risk for further clinical deterioration. Furthermore, it is not anticipated that the patient will be medically stable for discharge from the hospital within 2 midnights of admission.   * I certify that at the point of admission it is my clinical judgment that the patient will require inpatient hospital care spanning beyond 2 midnights from the point of admission due to high intensity of service, high risk for further deterioration and high frequency of surveillance required.*       Date of Service 11/01/2021    Ivor Costa [facility]   If 7PM-7AM, please contact night-coverage [email or web address] 11/01/2021, 4:03 PM

## 2021-11-01 NOTE — ED Notes (Signed)
Pt eating breakfast 

## 2021-11-01 NOTE — ED Notes (Signed)
Pt to be admitted to room 239. Report given to Lydia,RN. VSS. Belongings sent with pt. Pt being transported by ED tech via wheelchair.

## 2021-11-01 NOTE — ED Triage Notes (Addendum)
Report per EMS. Reports shortness of breath x 1 hour that woke her up from sleep. No relief with albuterol inhaler. Pt admits to chest pain rated 5/10. Audible wheezing heard. Hx asthma

## 2021-11-02 DIAGNOSIS — R000 Tachycardia, unspecified: Secondary | ICD-10-CM

## 2021-11-02 DIAGNOSIS — J9601 Acute respiratory failure with hypoxia: Secondary | ICD-10-CM | POA: Diagnosis not present

## 2021-11-02 DIAGNOSIS — J4541 Moderate persistent asthma with (acute) exacerbation: Secondary | ICD-10-CM

## 2021-11-02 MED ORDER — IPRATROPIUM BROMIDE 0.02 % IN SOLN
0.5000 mg | Freq: Four times a day (QID) | RESPIRATORY_TRACT | Status: DC
Start: 2021-11-02 — End: 2021-11-02
  Administered 2021-11-02: 0.5 mg via RESPIRATORY_TRACT
  Filled 2021-11-02: qty 2.5

## 2021-11-02 MED ORDER — FLUTICASONE-SALMETEROL 250-50 MCG/ACT IN AEPB: 1.0000 | INHALATION_SPRAY | Freq: Two times a day (BID) | RESPIRATORY_TRACT | 1 refills | Status: DC

## 2021-11-02 MED ORDER — DM-GUAIFENESIN ER 30-600 MG PO TB12: 1.0000 | ORAL_TABLET | Freq: Two times a day (BID) | ORAL | Status: AC | PRN

## 2021-11-02 MED ORDER — LEVALBUTEROL HCL 0.63 MG/3ML IN NEBU: 0.6300 mg | INHALATION_SOLUTION | RESPIRATORY_TRACT | Status: DC | PRN

## 2021-11-02 MED ORDER — LEVALBUTEROL HCL 0.63 MG/3ML IN NEBU
0.6300 mg | INHALATION_SOLUTION | Freq: Four times a day (QID) | RESPIRATORY_TRACT | Status: DC
Start: 2021-11-02 — End: 2021-11-02
  Administered 2021-11-02: 0.63 mg via RESPIRATORY_TRACT
  Filled 2021-11-02: qty 3

## 2021-11-02 MED ORDER — ALBUTEROL SULFATE HFA 108 (90 BASE) MCG/ACT IN AERS
2.0000 | INHALATION_SPRAY | Freq: Four times a day (QID) | RESPIRATORY_TRACT | 2 refills | Status: DC | PRN
Start: 2021-11-02 — End: 2023-01-07

## 2021-11-02 MED ORDER — PREDNISONE 50 MG PO TABS
50.0000 mg | ORAL_TABLET | Freq: Every day | ORAL | Status: DC
  Administered 2021-11-02: 50 mg via ORAL
  Filled 2021-11-02: qty 1

## 2021-11-02 NOTE — Progress Notes (Signed)
SATURATION QUALIFICATIONS: (This note is used to comply with regulatory documentation for home oxygen)  Patient Saturations on Room Air at Rest = 92%  Patient Saturations on Room Air while Ambulating = 89%    Please briefly explain why patient needs home oxygen:  Pt denies SOB, dizziness, chest pain with ambulation. Does not require home O2

## 2021-11-02 NOTE — Assessment & Plan Note (Signed)
Likely due to nebulizers and respiratory distress from asthma.  Resolved.

## 2021-11-02 NOTE — Discharge Summary (Signed)
Physician Discharge Summary  Laura Holder OEV:[id number] DOB: [birth year] DOA: 11/01/2021  PCP: [facility]  Admit date: 11/01/2021 Discharge date: 11/02/2021 Admitted From: Home Disposition: Home Recommendations for Outpatient Follow-up:  Follow ups as below. Please obtain CBC/BMP/Mag at follow up Please follow up on the following pending results: Full RVP  Home Health: Not indicated Equipment/Devices: Not indicated  Discharge Condition: Stable CODE STATUS: Full code  Follow-up Information     [facility]. Schedule an appointment as soon as possible for a visit in 1 week(s).   Specialty: General Practice Contact information: [address] [place] [address] [phone]                 Hospital course 21 year old F with PMH of mild intermittent asthma presenting with shortness of breath, slightly productive cough and chest tightness and admitted for acute respiratory failure with hypoxia in the setting of asthma exacerbation.  She was tachycardic to 150s.  She desaturated to 89% on RA requiring supplemental oxygen.  She had increased work of breathing.  COVID-19 PCR negative.  Chest x-ray without acute finding.  She was given systemic steroid, magnesium sulfate and nebulizers, and admitted.   The next day, he breathing improved tremendously.  She was ambulated on room air with minimal desaturation to 89% on RA but without respiratory distress.  She is discharged on p.o. prednisone, Advair and as needed albuterol.  Follow-up with primary care doctor in 1 to 2 weeks.  Full RVP panel pending at time of discharge.   See individual problem list below for more on hospital course.  Problems addressed during this hospitalization Problem  Acute respiratory failure with hypoxia due to asthma exacerbation  Asthma Exacerbation  Sinus Tachycardia    Assessment and Plan: * Acute respiratory failure with hypoxia  due to asthma exacerbation Desaturated to 89% on room air.  Respiratory distress improved. -Discharged on prednisone, Advair and as needed albuterol. -Full RVP panel pending  Sinus tachycardia Likely due to nebulizers and respiratory distress from asthma.  Resolved.    Vital signs Vitals:   11/02/21 0430 11/02/21 0814 11/02/21 0822 11/02/21 1149  BP: 122/69 116/83  117/79  Pulse: 82 64  (!) 102  Temp: 98.7 F (37.1 C) 98.9 F (37.2 C)  99.3 F (37.4 C)  Resp: 20 16  16   Height:      Weight:      SpO2: 94% 96% 95% 93%  TempSrc: Oral Oral  Oral  BMI (Calculated):         Discharge exam  GENERAL: No apparent distress.  Nontoxic. HEENT: MMM.  Vision and hearing grossly intact.  NECK: Supple.  No apparent JVD.  RESP:  No IWOB.  Fair aeration bilaterally. CVS:  RRR. Heart sounds normal.  ABD/GI/GU: BS+. Abd soft, NTND.  MSK/EXT:  Moves extremities. No apparent deformity. No edema.  SKIN: no apparent skin lesion or wound NEURO: Awake and alert. Oriented appropriately.  No apparent focal neuro deficit. PSYCH: Calm. Normal affect.   Discharge Instructions Discharge Instructions     Call MD for:  difficulty breathing, headache or visual disturbances   Complete by: As directed    Call MD for:  persistant nausea and vomiting   Complete by: As directed    Diet general   Complete by: As directed    Discharge instructions   Complete by: As directed    It has been a pleasure taking care of you!  You  were hospitalized due to asthma exacerbation.  Your symptoms improved with treatment.  We are discharging you on more steroid and breathing treatments.  It is very important that you use your medications as prescribed.  Follow-up with your primary care doctor in 1 to 2 weeks or sooner if needed.   Take care,   Increase activity slowly   Complete by: As directed    Increase activity slowly   Complete by: As directed       Allergies as of 11/02/2021       Reactions    Shellfish Allergy Swelling        Medication List     TAKE these medications    albuterol 108 (90 Base) MCG/ACT inhaler Commonly known as: VENTOLIN HFA Inhale 2 puffs into the lungs every 6 (six) hours as needed for wheezing or shortness of breath.   dextromethorphan-guaiFENesin 30-600 MG 12hr tablet Commonly known as: MUCINEX DM Take 1 tablet by mouth 2 (two) times daily as needed for up to 5 days for cough.   fluticasone-salmeterol 250-50 MCG/ACT Aepb Commonly known as: Advair Diskus Inhale 1 puff into the lungs in the morning and at bedtime.        Consultations: None  Procedures/Studies:   DG Chest Portable 1 View  Result Date: 11/01/2021 CLINICAL DATA:  21 year old female with history of shortness of breath. EXAM: PORTABLE CHEST 1 VIEW COMPARISON:  Chest x-ray 03/07/2021. FINDINGS: Lung volumes are normal. No consolidative airspace disease. No pleural effusions. No pneumothorax. No pulmonary nodule or mass noted. Pulmonary vasculature and the cardiomediastinal silhouette are within normal limits. IMPRESSION: No radiographic evidence of acute cardiopulmonary disease. Electronically Signed   By: Trudie Reed M.D.   On: 11/01/2021 06:25       The results of significant diagnostics from this hospitalization (including imaging, microbiology, ancillary and laboratory) are listed below for reference.     Microbiology: Recent Results (from the past 240 hour(s))  SARS Coronavirus 2 by RT PCR (hospital order, performed in [facility] hospital lab) *cepheid single result test* Anterior Nasal Swab     Status: None   Collection Time: 11/01/21  6:07 AM   Specimen: Anterior Nasal Swab  Result Value Ref Range Status   SARS Coronavirus 2 by RT PCR NEGATIVE NEGATIVE Final    Comment: (NOTE) SARS-CoV-2 target nucleic acids are NOT DETECTED.  The SARS-CoV-2 RNA is generally detectable in upper and lower respiratory specimens during the acute phase of infection. The  lowest concentration of SARS-CoV-2 viral copies this assay can detect is 250 copies / mL. A negative result does not preclude SARS-CoV-2 infection and should not be used as the sole basis for treatment or other patient management decisions.  A negative result may occur with improper specimen collection / handling, submission of specimen other than nasopharyngeal swab, presence of viral mutation(s) within the areas targeted by this assay, and inadequate number of viral copies (<250 copies / mL). A negative result must be combined with clinical observations, patient history, and epidemiological information.  Fact Sheet for Patients:   RoadLapTop.co.za  Fact Sheet for Healthcare Providers: http://kim-miller.com/  This test is not yet approved or  cleared by the [place] FDA and has been authorized for detection and/or diagnosis of SARS-CoV-2 by FDA under an Emergency Use Authorization (EUA).  This EUA will remain in effect (meaning this test can be used) for the duration of the COVID-19 declaration under Section 564(b)(1) of the Act, 21 U.S.C. section 360bbb-3(b)(1), unless the authorization  is terminated or revoked sooner.  Performed at [facility], [address] [place], [place] [address]      Labs:  CBC: Recent Labs  Lab 11/01/21 0558  WBC 9.3  NEUTROABS 4.2  HGB 15.8*  HCT 48.7*  MCV 87.3  PLT 398   BMP &GFR Recent Labs  Lab 11/01/21 0558  NA 139  K 3.6  CL 108  CO2 23  GLUCOSE 103*  BUN 7  CREATININE 0.65  CALCIUM 9.2   Estimated Creatinine Clearance: 105 mL/min (by C-G formula based on SCr of 0.65 mg/dL). Liver & Pancreas: No results for input(s): AST, ALT, ALKPHOS, BILITOT, PROT, ALBUMIN in the last 168 hours. No results for input(s): LIPASE, AMYLASE in the last 168 hours. No results for input(s): AMMONIA in the last 168 hours. Diabetic: No results for input(s): HGBA1C in the last 72  hours. No results for input(s): GLUCAP in the last 168 hours. Cardiac Enzymes: No results for input(s): CKTOTAL, CKMB, CKMBINDEX, TROPONINI in the last 168 hours. No results for input(s): PROBNP in the last 8760 hours. Coagulation Profile: No results for input(s): INR, PROTIME in the last 168 hours. Thyroid Function Tests: No results for input(s): TSH, T4TOTAL, FREET4, T3FREE, THYROIDAB in the last 72 hours. Lipid Profile: No results for input(s): CHOL, HDL, LDLCALC, TRIG, CHOLHDL, LDLDIRECT in the last 72 hours. Anemia Panel: No results for input(s): VITAMINB12, FOLATE, FERRITIN, TIBC, IRON, RETICCTPCT in the last 72 hours. Urine analysis:    Component Value Date/Time   COLORURINE STRAW (A) 08/22/2020 1527   APPEARANCEUR CLEAR (A) 08/22/2020 1527   LABSPEC 1.005 08/22/2020 1527   PHURINE 7.0 08/22/2020 1527   GLUCOSEU NEGATIVE 08/22/2020 1527   HGBUR NEGATIVE 08/22/2020 1527   BILIRUBINUR NEGATIVE 08/22/2020 1527   KETONESUR NEGATIVE 08/22/2020 1527   PROTEINUR NEGATIVE 08/22/2020 1527   NITRITE NEGATIVE 08/22/2020 1527   LEUKOCYTESUR TRACE (A) 08/22/2020 1527   Sepsis Labs: Invalid input(s): PROCALCITONIN, LACTICIDVEN   Time coordinating discharge: 45 minutes  SIGNED:  Almon Herculesaye T Khyler Urda, MD  [facility] 11/02/2021, 6:30 PM

## 2021-11-02 NOTE — Hospital Course (Addendum)
21 year old F with PMH of mild intermittent asthma presenting with shortness of breath, slightly productive cough and chest tightness and admitted for acute respiratory failure with hypoxia in the setting of asthma exacerbation.  She was tachycardic to 150s.  She desaturated to 89% on RA requiring supplemental oxygen.  She had increased work of breathing.  COVID-19 PCR negative.  Chest x-ray without acute finding.  She was given systemic steroid, magnesium sulfate and nebulizers, and admitted.   The next day, he breathing improved tremendously.  She was ambulated on room air with minimal desaturation to 89% on RA but without respiratory distress.  She is discharged on p.o. prednisone, Advair and as needed albuterol.  Follow-up with primary care doctor in 1 to 2 weeks.  Full RVP panel pending at time of discharge.

## 2022-01-08 ENCOUNTER — Ambulatory Visit: Payer: Medicaid Other | Admitting: Family Medicine

## 2022-01-08 ENCOUNTER — Ambulatory Visit: Payer: Medicaid Other

## 2022-01-08 DIAGNOSIS — Z113 Encounter for screening for infections with a predominantly sexual mode of transmission: Secondary | ICD-10-CM

## 2022-01-08 LAB — WET PREP FOR TRICH, YEAST, CLUE
Trichomonas Exam: NEGATIVE
Yeast Exam: NEGATIVE

## 2022-01-08 NOTE — Progress Notes (Signed)
Pt here for STI screening. Denies any recent contacts or any symptoms. Denies vaginal discharge, discoloration or odor. WET PREP reveals clue cells and amine. Per standing orders, in absence of other symptoms or criteria, pt not treated in clinic today. Pt encouraged to return if symptoms develop. Pt verbalized understanding of further labwork pending.

## 2022-01-09 NOTE — Progress Notes (Signed)
Attestation: I agree with the advice given to this patient by our nurse staff.  I have reviewed the RN's note and chart.   Federico Flake, MD, MPH, ABFM Medical Director  [facility] Department

## 2022-01-12 ENCOUNTER — Other Ambulatory Visit: Payer: Medicaid Other

## 2022-01-12 NOTE — Progress Notes (Signed)
01-12-2022 Notified by [facility] lab staff Melven Sartorius, per telephone call from [facility], serum specimen drawn for HIV and RPR will need to be redrawn due to a problem with the specimen. [facility] will notify patient and schedule an appointment for lab only visit. Patient should not be charged for visit or lab draw fee. Herby Abraham RN.  01-12-2022 amendment to above note. Date for above HIV and RPR labs was 01-08-2022. Both need to be redrawn at no charge to the patient. Herby Abraham RN.

## 2022-01-13 ENCOUNTER — Other Ambulatory Visit: Payer: Medicaid Other

## 2022-02-09 NOTE — Addendum Note (Signed)
Addended by: Heywood Bene on: 02/09/2022 11:26 AM   Modules accepted: Orders

## 2022-03-24 IMAGING — CR DG CHEST 2V
1 series · 2 of 2 positions shown · non-contrast
Comparison: 03/17/2020

CLINICAL DATA: Shortness of breath, wheezing.  Asthma

EXAM:
CHEST - 2 VIEW

[Series 1: dg chest 2 view · 0.14mm/px · 2 of 2 slices shown]
[im 1/2]
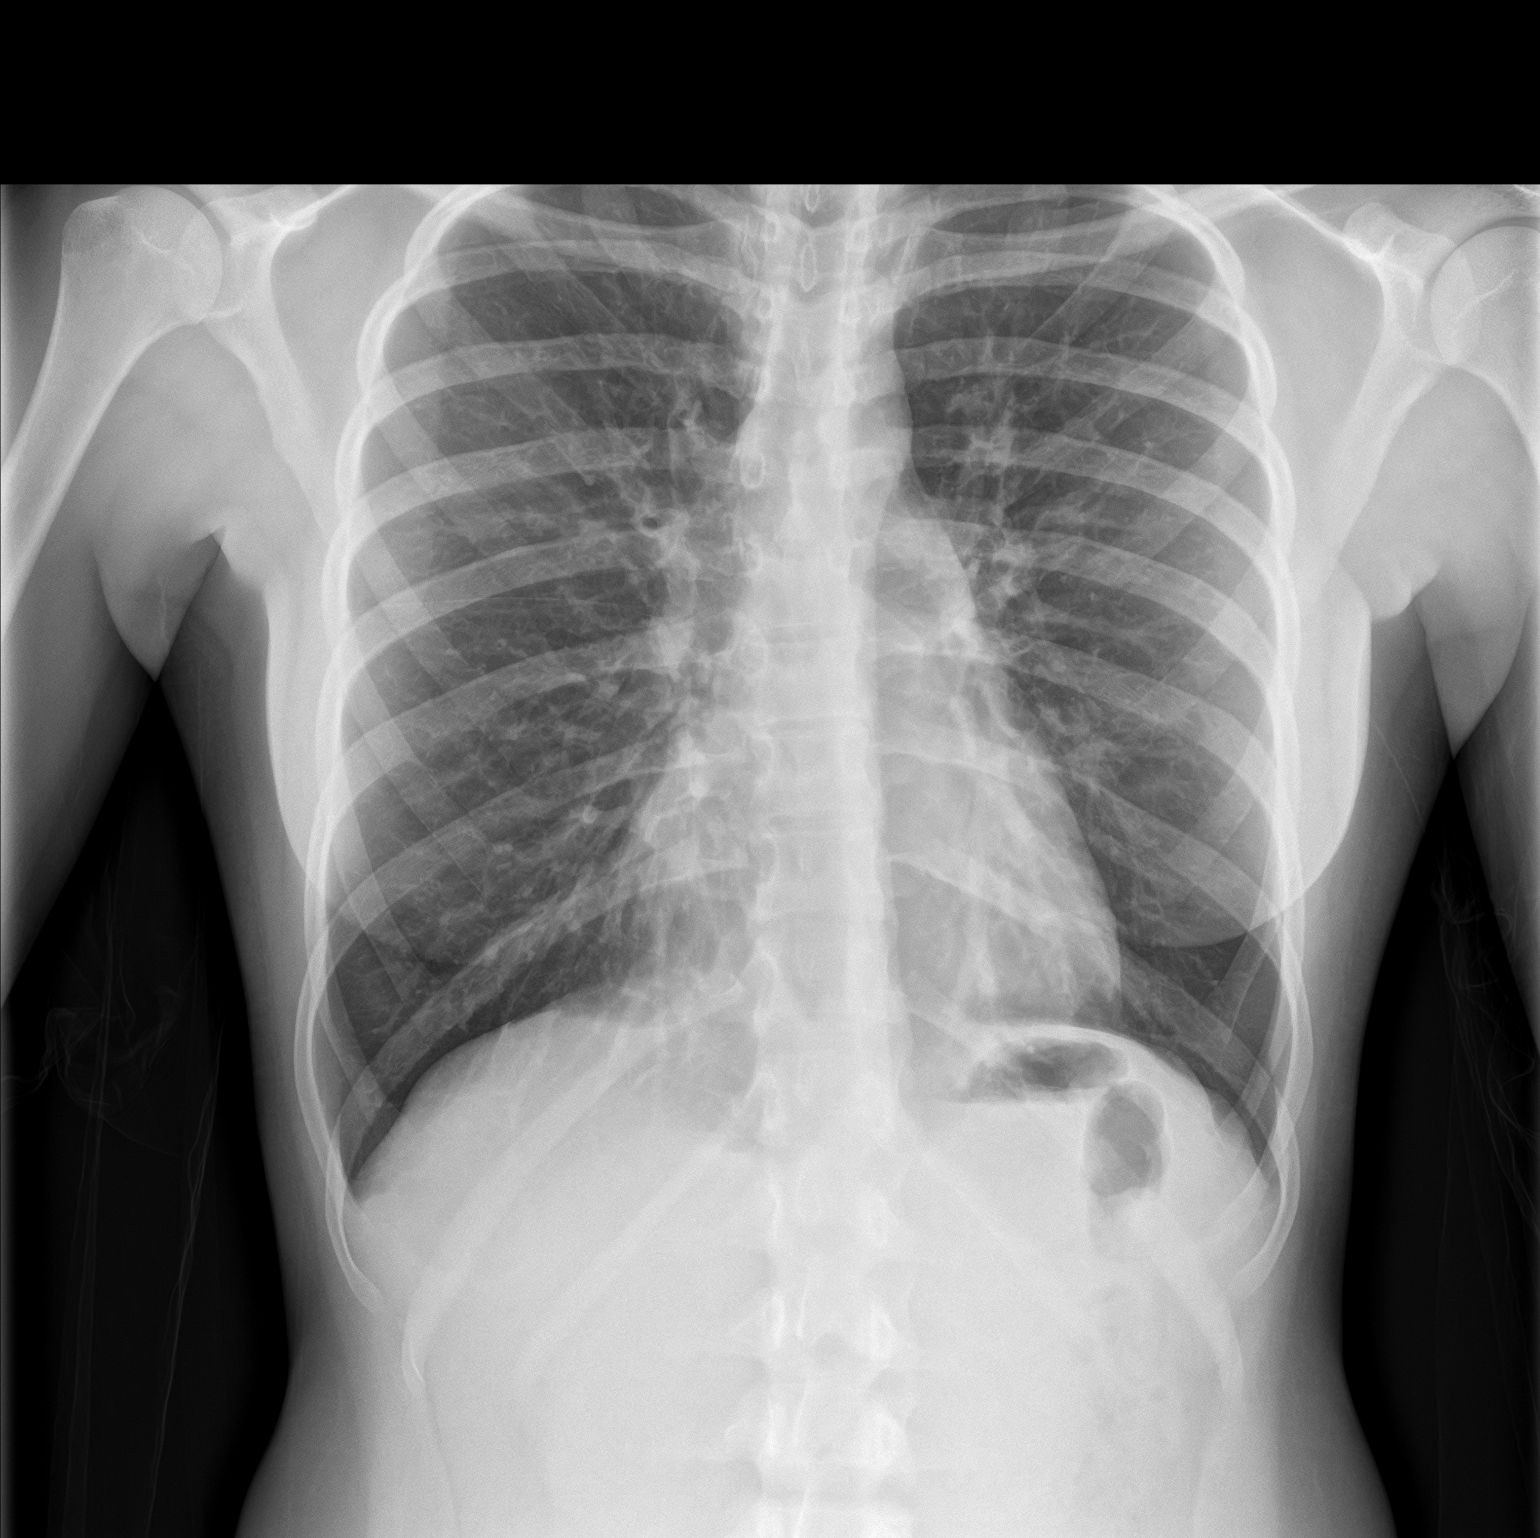
[im 2/2]
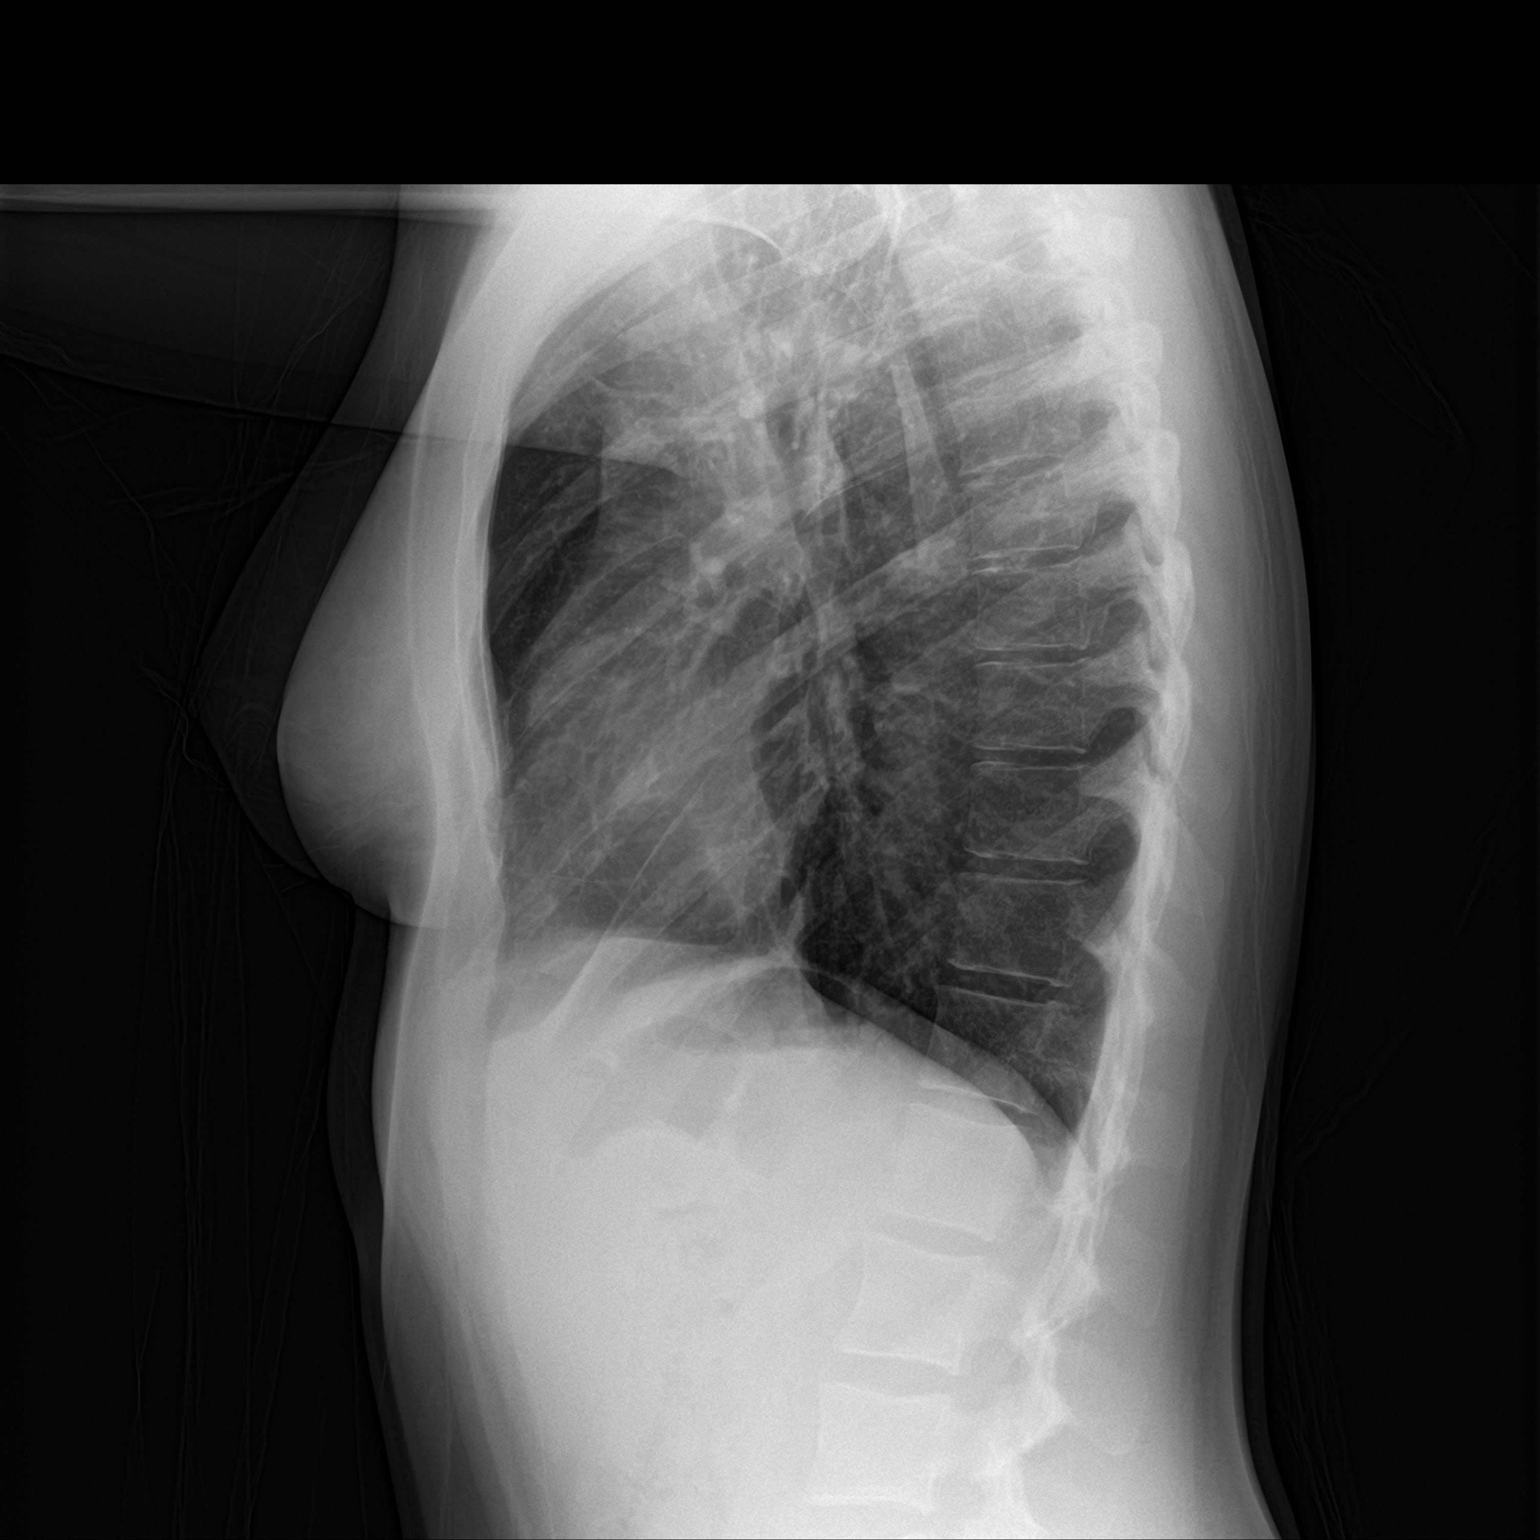

[2 of 2 positions shown; findings below may reference images not displayed]

FINDINGS: The heart size and mediastinal contours are within normal limits.
Prominent bilateral perihilar peribronchial markings. No airspace
consolidation. No pleural effusion or pneumothorax. The visualized
skeletal structures are unremarkable.
IMPRESSION: Findings most compatible with reactive airways disease/asthma. No
airspace consolidation.

## 2022-08-20 ENCOUNTER — Emergency Department: Payer: Medicaid Other

## 2022-08-20 ENCOUNTER — Emergency Department
Admission: EM | Admit: 2022-08-20 | Discharge: 2022-08-20 | Disposition: A | Discharge status: Home / Self Care | Payer: Medicaid Other | Attending: Emergency Medicine | Admitting: Emergency Medicine

## 2022-08-20 ENCOUNTER — Other Ambulatory Visit: Payer: Self-pay

## 2022-08-20 DIAGNOSIS — Z7951 Long term (current) use of inhaled steroids: Secondary | ICD-10-CM | POA: Diagnosis not present

## 2022-08-20 DIAGNOSIS — R000 Tachycardia, unspecified: Secondary | ICD-10-CM | POA: Diagnosis not present

## 2022-08-20 DIAGNOSIS — Z20822 Contact with and (suspected) exposure to covid-19: Secondary | ICD-10-CM | POA: Diagnosis not present

## 2022-08-20 DIAGNOSIS — J45901 Unspecified asthma with (acute) exacerbation: Secondary | ICD-10-CM | POA: Diagnosis not present

## 2022-08-20 DIAGNOSIS — R0602 Shortness of breath: Secondary | ICD-10-CM | POA: Diagnosis present

## 2022-08-20 LAB — COMPREHENSIVE METABOLIC PANEL
ALT: 30 U/L (ref 0–44)
AST: 25 U/L (ref 15–41)
Albumin: 4.3 g/dL (ref 3.5–5.0)
Alkaline Phosphatase: 82 U/L (ref 38–126)
Anion gap: 10 (ref 5–15)
BUN: 7 mg/dL (ref 6–20)
CO2: 23 mmol/L (ref 22–32)
Calcium: 9.3 mg/dL (ref 8.9–10.3)
Chloride: 104 mmol/L (ref 98–111)
Creatinine, Ser: 0.56 mg/dL (ref 0.44–1.00)
GFR, Estimated: >60 mL/min (ref >60)
Glucose, Bld: 81 mg/dL (ref 70–99)
Potassium: 3.4 mmol/L — ABNORMAL LOW (ref 3.5–5.1)
Sodium: 137 mmol/L (ref 135–145)
Total Bilirubin: 0.5 mg/dL (ref 0.3–1.2)
Total Protein: 8 g/dL (ref 6.5–8.1)

## 2022-08-20 LAB — CBC WITH DIFFERENTIAL/PLATELET
Abs Immature Granulocytes: 0.03 10*3/uL (ref 0.00–0.07)
Basophils Absolute: 0.1 10*3/uL (ref 0.0–0.1)
Basophils Relative: 1 %
Eosinophils Absolute: 0.7 10*3/uL — ABNORMAL HIGH (ref 0.0–0.5)
Eosinophils Relative: 7 %
HCT: 46.3 % — ABNORMAL HIGH (ref 36.0–46.0)
Hemoglobin: 14.9 g/dL (ref 12.0–15.0)
Immature Granulocytes: 0 %
Lymphocytes Relative: 15 %
Lymphs Abs: 1.6 10*3/uL (ref 0.7–4.0)
MCH: 28.5 pg (ref 26.0–34.0)
MCHC: 32.2 g/dL (ref 30.0–36.0)
MCV: 88.7 fL (ref 80.0–100.0)
Monocytes Absolute: 0.9 10*3/uL (ref 0.1–1.0)
Monocytes Relative: 8 %
Neutro Abs: 7.7 10*3/uL (ref 1.7–7.7)
Neutrophils Relative %: 69 %
Platelets: 439 10*3/uL — ABNORMAL HIGH (ref 150–400)
RBC: 5.22 MIL/uL — ABNORMAL HIGH (ref 3.87–5.11)
RDW: 13.1 % (ref 11.5–15.5)
WBC: 11 10*3/uL — ABNORMAL HIGH (ref 4.0–10.5)
nRBC: 0 % (ref 0.0–0.2)

## 2022-08-20 LAB — POC URINE PREG, ED: Preg Test, Ur: NEGATIVE

## 2022-08-20 LAB — RESP PANEL BY RT-PCR (RSV, FLU A&B, COVID)  RVPGX2
Influenza A by PCR: NEGATIVE
Influenza B by PCR: NEGATIVE
Resp Syncytial Virus by PCR: NEGATIVE
SARS Coronavirus 2 by RT PCR: NEGATIVE

## 2022-08-20 LAB — TROPONIN I (HIGH SENSITIVITY): Troponin I (High Sensitivity): <2 ng/L (ref <18)

## 2022-08-20 MED ORDER — SODIUM CHLORIDE 0.9 % IV BOLUS: 1000.0000 mL | Freq: Once | INTRAVENOUS | Status: DC

## 2022-08-20 MED ORDER — ALBUTEROL SULFATE HFA 108 (90 BASE) MCG/ACT IN AERS: 2.0000 | INHALATION_SPRAY | Freq: Four times a day (QID) | RESPIRATORY_TRACT | 2 refills | Status: DC | PRN

## 2022-08-20 MED ORDER — PREDNISONE 10 MG PO TABS: 10.0000 mg | ORAL_TABLET | Freq: Every day | ORAL | 0 refills | Status: DC

## 2022-08-20 MED ORDER — METHYLPREDNISOLONE SODIUM SUCC 125 MG IJ SOLR
125.0000 mg | Freq: Once | INTRAMUSCULAR | Status: AC
  Administered 2022-08-20: 125 mg via INTRAVENOUS
  Filled 2022-08-20: qty 2

## 2022-08-20 MED ORDER — MAGNESIUM SULFATE 2 GM/50ML IV SOLN
2.0000 g | Freq: Once | INTRAVENOUS | Status: AC
  Administered 2022-08-20: 2 g via INTRAVENOUS
  Filled 2022-08-20: qty 50

## 2022-08-20 MED ORDER — ALBUTEROL SULFATE (2.5 MG/3ML) 0.083% IN NEBU: 2.5000 mg | INHALATION_SOLUTION | Freq: Four times a day (QID) | RESPIRATORY_TRACT | 1 refills | Status: DC | PRN

## 2022-08-20 MED ORDER — IPRATROPIUM-ALBUTEROL 0.5-2.5 (3) MG/3ML IN SOLN
3.0000 mL | Freq: Once | RESPIRATORY_TRACT | Status: AC
  Administered 2022-08-20: 3 mL via RESPIRATORY_TRACT
  Filled 2022-08-20: qty 3

## 2022-08-20 MED ORDER — IPRATROPIUM-ALBUTEROL 0.5-2.5 (3) MG/3ML IN SOLN
9.0000 mL | Freq: Once | RESPIRATORY_TRACT | Status: AC
  Administered 2022-08-20: 9 mL via RESPIRATORY_TRACT
  Filled 2022-08-20: qty 3

## 2022-08-20 MED ORDER — POTASSIUM CHLORIDE CRYS ER 20 MEQ PO TBCR
20.0000 meq | EXTENDED_RELEASE_TABLET | Freq: Once | ORAL | Status: AC
  Administered 2022-08-20: 20 meq via ORAL
  Filled 2022-08-20: qty 1

## 2022-08-20 MED ORDER — SODIUM CHLORIDE 0.9 % IV BOLUS
500.0000 mL | Freq: Once | INTRAVENOUS | Status: AC
  Administered 2022-08-20: 500 mL via INTRAVENOUS

## 2022-08-20 NOTE — ED Notes (Signed)
2L oxygen applied to increase sats to 92%.

## 2022-08-20 NOTE — Discharge Instructions (Signed)
As we discussed please start your prednisone taper tomorrow 08/21/2022.  Please use your albuterol nebulizer every 2 hours or so if needed for shortness of breath.  If you find that the shortness of breath is worsening, not improving or at any point you feel like you are having trouble breathing please return to the emergency department immediately.  Otherwise please follow-up with your doctor as soon as possible.

## 2022-08-20 NOTE — ED Triage Notes (Signed)
Pt here with SOB x2 days. Pt states her inhaler has not been working. Pt sats 88% on RA with audible wheezing.

## 2022-08-20 NOTE — ED Provider Notes (Signed)
[facility] Provider Note    Event Date/Time   First MD Initiated Contact with Patient 08/20/22 1309     (approximate)   History   Chief Complaint Shortness of Breath   HPI  Laura Holder is a 21 y.o. female with past medical history of asthma who presents to the ED complaining of shortness of breath.  Patient reports that she has been feeling increasingly short of breath over the past 3 days with associated tightness in her chest.  She reports the tightness and discomfort seems to be worse in the right side of her chest, describes discomfort as constant and not exacerbated or alleviated by anything, including a deep breath.  She denies any associated fevers or cough, has not noticed any pain or swelling in her legs.  She was using her inhaler with partial relief, however inhaler stopped working earlier today, when the symptoms became more severe.  She describes symptoms as similar to prior asthma exacerbations and she has required hospitalization in the past but has never had to be intubated.     Physical Exam   Triage Vital Signs: ED Triage Vitals  Enc Vitals Group     BP 08/20/22 1259 (!) 150/91     Pulse Rate 08/20/22 1259 (!) 134     Resp 08/20/22 1259 (!) 24     Temp 08/20/22 1259 98.4 F (36.9 C)     Temp Source 08/20/22 1259 Oral     SpO2 08/20/22 1259 (!) 88 %     Weight 08/20/22 1300 132 lb 0.9 oz (59.9 kg)     Height 08/20/22 1300 5\' 6"  (1.676 m)     Head Circumference --      Peak Flow --      Pain Score 08/20/22 1259 7     Pain Loc --      Pain Edu? --      Excl. in [place]? --     Most recent vital signs: Vitals:   08/20/22 1445 08/20/22 1515  BP:    Pulse: (!) 113 (!) 126  Resp: (!) 27 (!) 23  Temp:    SpO2: 94% 93%    Constitutional: Alert and oriented. Eyes: Conjunctivae are normal. Head: Atraumatic. Nose: No congestion/rhinnorhea. Mouth/Throat: Mucous membranes are moist.  Cardiovascular: Tachycardic, regular rhythm.  Grossly normal heart sounds.  2+ radial pulses bilaterally. Respiratory: Tachypneic with increased respiratory effort, inspiratory and expiratory wheezing throughout. Gastrointestinal: Soft and nontender. No distention. Musculoskeletal: No lower extremity tenderness nor edema.  Neurologic:  Normal speech and language. No gross focal neurologic deficits are appreciated.    ED Results / Procedures / Treatments   Labs (all labs ordered are listed, but only abnormal results are displayed) Labs Reviewed  CBC WITH DIFFERENTIAL/PLATELET - Abnormal; Notable for the following components:      Result Value   WBC 11.0 (*)    RBC 5.22 (*)    HCT 46.3 (*)    Platelets 439 (*)    Eosinophils Absolute 0.7 (*)    All other components within normal limits  COMPREHENSIVE METABOLIC PANEL - Abnormal; Notable for the following components:   Potassium 3.4 (*)    All other components within normal limits  RESP PANEL BY RT-PCR (RSV, FLU A&B, COVID)  [id number]  POC URINE PREG, ED  TROPONIN I (HIGH SENSITIVITY)     EKG  ED ECG REPORT I, Blake Divine, the attending physician, personally viewed and interpreted this ECG.   Date: 08/20/2022  EKG Time: 13:00  Rate: 132  Rhythm: sinus tachycardia  Axis: RAD  Intervals:none  ST&T Change: None  RADIOLOGY Chest x-ray reviewed and interpreted by me with no infiltrate, edema, or effusion.  PROCEDURES:  Critical Care performed: No  Procedures   MEDICATIONS ORDERED IN ED: Medications  ipratropium-albuterol (DUONEB) 0.5-2.5 (3) MG/3ML nebulizer solution 9 mL (9 mLs Nebulization Given 08/20/22 1344)  methylPREDNISolone sodium succinate (SOLU-MEDROL) 125 mg/2 mL injection 125 mg (125 mg Intravenous Given 08/20/22 1344)  magnesium sulfate IVPB 2 g 50 mL (2 g Intravenous New Bag/Given 08/20/22 1343)  potassium chloride SA (KLOR-CON M) CR tablet 20 mEq (20 mEq Oral Given 08/20/22 1439)  ipratropium-albuterol (DUONEB) 0.5-2.5 (3) MG/3ML nebulizer solution 3  mL (3 mLs Nebulization Given 08/20/22 1446)  sodium chloride 0.9 % bolus 500 mL (500 mLs Intravenous New Bag/Given 08/20/22 1448)     IMPRESSION / MDM / ASSESSMENT AND PLAN / ED COURSE  I reviewed the triage vital signs and the nursing notes.                              22 y.o. female with past medical history of asthma who presents to the ED complaining of increasing difficulty breathing with tightness in the right side of her chest over the past 3 days.  Patient's presentation is most consistent with acute presentation with potential threat to life or bodily function.  Differential diagnosis includes, but is not limited to, asthma exacerbation, ACS, PE, pneumonia, pneumothorax, COVID-19, influenza.  Patient nontoxic-appearing, in some mild respiratory distress with tachypnea and tachycardia, noted to be hypoxic to 88% on room air in triage.  She was placed on 2 L nasal cannula with improvement, has significant wheezing on my assessment.  We will treat with DuoNebs, IV Solu-Medrol, and IV magnesium.  EKG shows sinus tachycardia with no ischemic changes, labs including troponin are pending at this time.  Chest x-ray is also pending at this time, suspicion for PE is low at this time but if patient does not respond to treatment then would consider further workup.  Chest x-ray is unremarkable, labs are reassuring with no significant anemia, leukocytosis, electrolyte abnormality, or AKI.  Troponin within normal limits and I doubt ACS or PE as patient seems to be improving following treatment for asthma.  We will give additional albuterol and she is completing IV magnesium now.  Patient turned over to oncoming vita pending reassessment following treatment.  Patient has been weaned off of supplemental oxygen, currently maintaining oxygen saturations at 93% on room air.      FINAL CLINICAL IMPRESSION(S) / ED DIAGNOSES   Final diagnoses:  Exacerbation of persistent asthma, unspecified asthma  severity     Rx / DC Orders   ED Discharge Orders     None        Note:  This document was prepared using Dragon voice recognition software and may include unintentional dictation errors.   Blake Divine, MD 08/20/22 [phone]

## 2022-08-20 NOTE — ED Provider Notes (Signed)
-----------------------------------------   5:14 PM on 08/20/2022 ----------------------------------------- Patient care assumed from Dr. Charna Archer.  Patient has received all of her medications.  Currently satting 96% on room air.  Remains tachycardic around 110 to 120 bpm during my evaluation but recently did receive multiple breathing treatments.  Patient continues to have mild expiratory wheeze however she states she is feeling much better.  Recommended continued monitoring in the emergency department however the patient states she is ready to go home.  States she has been dealing with this for very long time and feels like this is very manageable at home in her current state.  We will place the patient on prednisone taper we will provide new albuterol nebulizer and MDI prescriptions.  I discussed very strict return precautions.  Patient understands the risk of going home but still wishes to go home.   Harvest Dark, MD 08/20/22 1721
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
## 2022-11-18 IMAGING — DX DG CHEST 1V PORT
1 series · 1 of 1 positions shown · non-contrast
Comparison: Chest x-ray 03/07/2021.

CLINICAL DATA: 21-year-old female with history of shortness of
breath.

EXAM:
PORTABLE CHEST 1 VIEW

[chest ap]
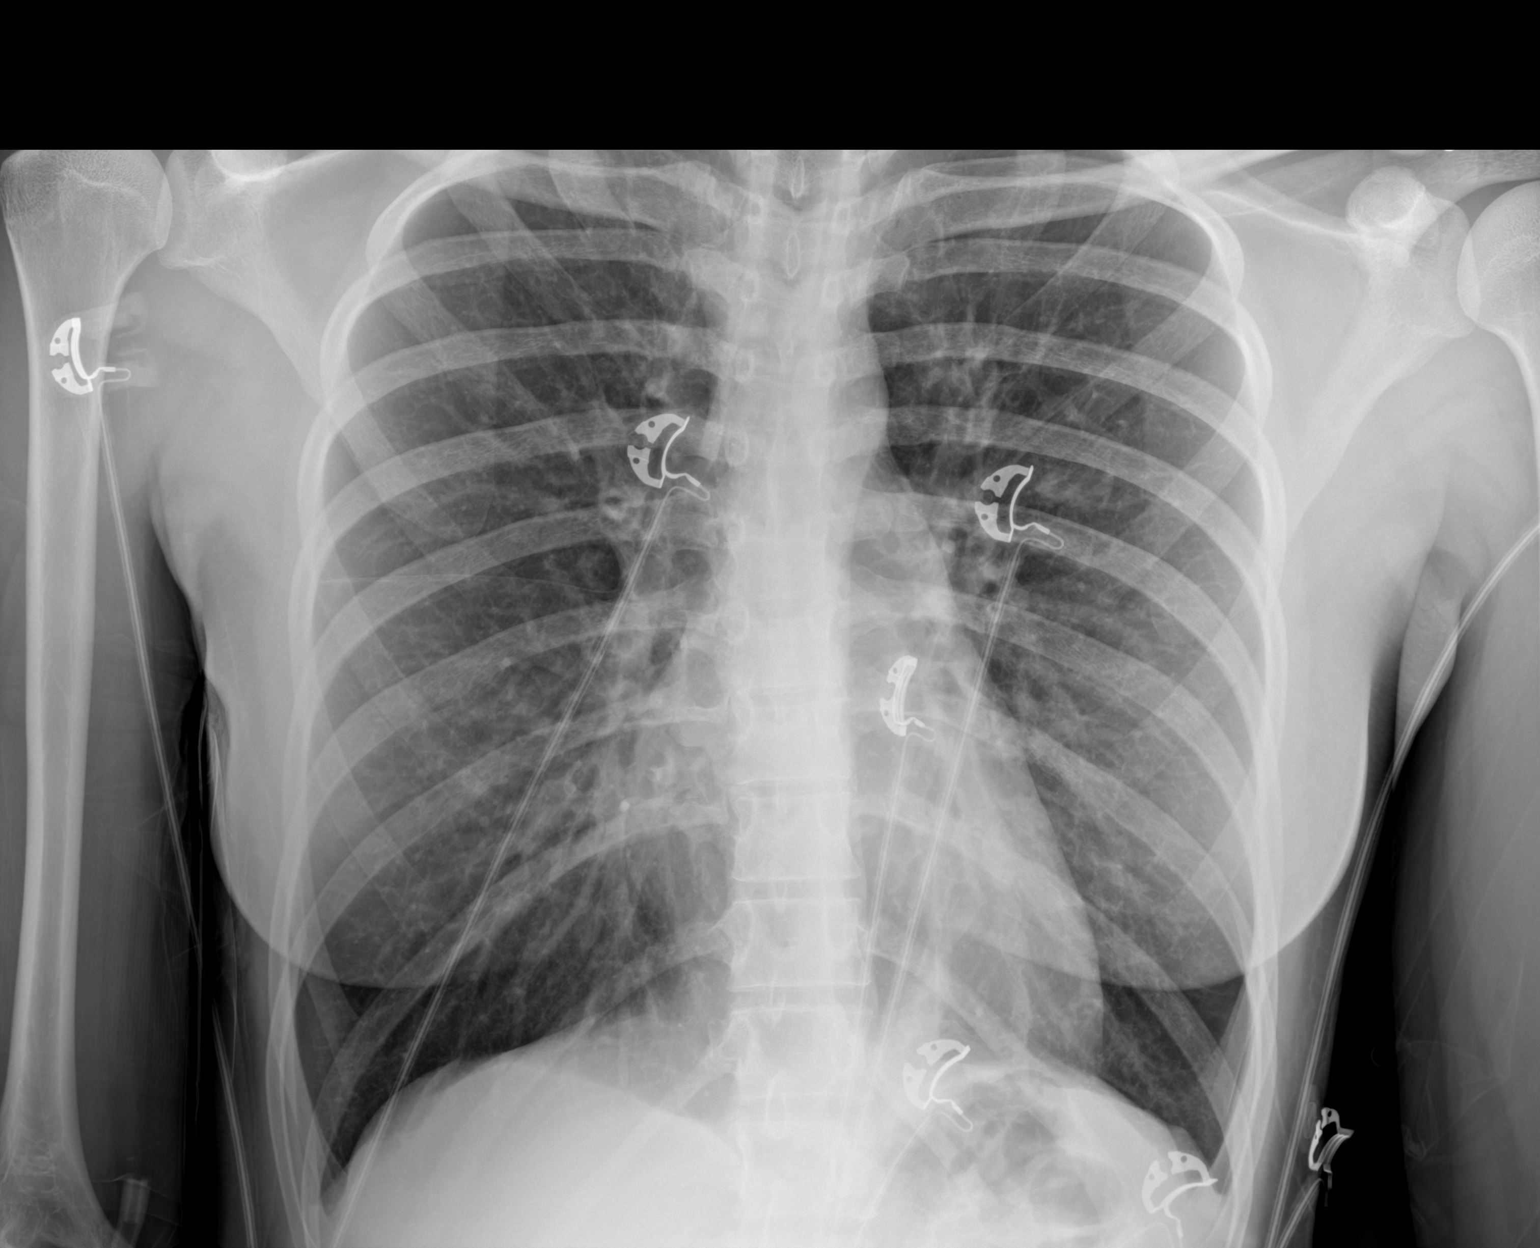

[1 of 1 positions shown; findings below may reference images not displayed]

FINDINGS: Lung volumes are normal. No consolidative airspace disease. No
pleural effusions. No pneumothorax. No pulmonary nodule or mass
noted. Pulmonary vasculature and the cardiomediastinal silhouette
are within normal limits.
IMPRESSION: No radiographic evidence of acute cardiopulmonary disease.

## 2023-01-07 ENCOUNTER — Other Ambulatory Visit: Payer: Self-pay | Admitting: Certified Nurse Midwife

## 2023-01-07 ENCOUNTER — Encounter: Payer: Self-pay | Admitting: Advanced Practice Midwife

## 2023-01-07 MED ORDER — ALBUTEROL SULFATE HFA 108 (90 BASE) MCG/ACT IN AERS: 2.0000 | INHALATION_SPRAY | Freq: Four times a day (QID) | RESPIRATORY_TRACT | 2 refills | Status: DC | PRN

## 2023-01-07 MED ORDER — FLUTICASONE-SALMETEROL 250-50 MCG/ACT IN AEPB: 1.0000 | INHALATION_SPRAY | Freq: Two times a day (BID) | RESPIRATORY_TRACT | 1 refills | Status: DC

## 2023-01-20 ENCOUNTER — Ambulatory Visit
Admission: EM | Admit: 2023-01-20 | Discharge: 2023-01-20 | Disposition: A | Discharge status: Home / Self Care | Payer: Medicaid Other | Attending: Family Medicine | Admitting: Family Medicine

## 2023-01-20 DIAGNOSIS — L301 Dyshidrosis [pompholyx]: Secondary | ICD-10-CM | POA: Diagnosis not present

## 2023-01-20 MED ORDER — TRIAMCINOLONE ACETONIDE 0.1 % EX OINT: 1.0000 | TOPICAL_OINTMENT | Freq: Two times a day (BID) | CUTANEOUS | 0 refills | Status: AC

## 2023-01-20 NOTE — ED Triage Notes (Addendum)
Pt presents to UC for eczema flare up x2 days. States she was on triamcinolone acetonide but has ran out.

## 2023-01-20 NOTE — ED Provider Notes (Signed)
[facility]    CSN: [id number] Arrival date & time: 01/20/23  1332      History   Chief Complaint Chief Complaint  Patient presents with   Eczema    HPI VERNESSA GAILES is a 22 y.o. female.   HPI  Linnaea presents for eczema flare-up on her right hand.  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  She ran out of her eczema cream recently. Maurie  has otherwise been well and has no other concerns.       Past Medical History:  Diagnosis Date   Asthma     Patient Active Problem List   Diagnosis Date Noted   Sinus tachycardia 11/02/2021   Asthma exacerbation 11/01/2021   Acute respiratory failure with hypoxia due to asthma exacerbation 11/01/2021   Encounter for supervision of normal first pregnancy in first trimester 05/22/2020   History of asthma 12/29/2018   Oral contraception initiation 12/29/2018    Past Surgical History:  Procedure Laterality Date   Dental procedure      OB History     Gravida  1   Para      Term      Preterm      AB      Living         SAB      IAB      Ectopic      Multiple      Live Births               Home Medications    Prior to Admission medications   Medication Sig Start Date End Date Taking? Authorizing Provider  albuterol (PROVENTIL) (2.5 MG/3ML) 0.083% nebulizer solution Take 3 mLs (2.5 mg total) by nebulization every 6 (six) hours as needed for wheezing or shortness of breath. 08/20/22  Yes Minna Antis, MD  albuterol (VENTOLIN HFA) 108 (90 Base) MCG/ACT inhaler Inhale 2 puffs into the lungs every 6 (six) hours as needed for wheezing or shortness of breath. 08/20/22  Yes Minna Antis, MD  albuterol (VENTOLIN HFA) 108 (90 Base) MCG/ACT inhaler Inhale 2 puffs into the lungs every 6 (six) hours as needed for wheezing or shortness of breath. 01/07/23  Yes Doreene Burke, CNM  fluticasone-salmeterol (ADVAIR DISKUS) 250-50 MCG/ACT AEPB Inhale 1 puff into  the lungs in the morning and at bedtime. 01/07/23  Yes Doreene Burke, CNM  triamcinolone ointment (KENALOG) 0.1 % Apply 1 Application topically 2 (two) times daily. 01/20/23  Yes Katha Cabal, DO    Family History Family History  Problem Relation Age of Onset   Asthma Mother    Asthma Father     Social History Social History   Tobacco Use   Smoking status: Never   Smokeless tobacco: Never  Vaping Use   Vaping status: Never Used  Substance Use Topics   Alcohol use: Not Currently    Comment: rarely   Drug use: Never     Allergies   Shellfish allergy and Bee pollen   Review of Systems Review of Systems :negative unless otherwise stated in HPI.      Physical Exam Triage Vital Signs ED Triage Vitals  Encounter Vitals Group     BP      Systolic BP Percentile      Diastolic BP Percentile      Pulse      Resp      Temp  Temp src      SpO2      Weight      Height      Head Circumference      Peak Flow      Pain Score      Pain Loc      Pain Education      Exclude from Growth Chart    No data found.  Updated Vital Signs BP 129/75 (BP Location: Right Arm)   Pulse 97   Temp 99.2 F (37.3 C) (Oral)   Resp 16   Ht 5\' 4"  (1.626 m)   Wt 67.9 kg   SpO2 95%   BMI 25.71 kg/m   Visual Acuity Right Eye Distance:   Left Eye Distance:   Bilateral Distance:    Right Eye Near:   Left Eye Near:    Bilateral Near:     Physical Exam  GEN: alert, well appearing female, in no acute distress  RESP: no increased work of breathing MSK: no hand edema, normal ROM of fingers and thumb   SKIN: warm and dry; erythematous scaly patches on right hand with some blistering concerning for dyshidrotic eczema      UC Treatments / Results  Labs (all labs ordered are listed, but only abnormal results are displayed) Labs Reviewed - No data to display  EKG   Radiology No results found.  Procedures Procedures (including critical care time)  Medications  Ordered in UC Medications - No data to display  Initial Impression / Assessment and Plan / UC Course  I have reviewed the triage vital signs and the nursing notes.  Pertinent labs & imaging results that were available during my care of the patient were reviewed by me and considered in my medical decision making (see chart for details).     Patient is a 22 y.o. female with history of ezcema who presents for acute flare.  Overall, patient is well-appearing and well-hydrated.  Vital signs stable.  TRUE is afebrile.  Exam concerning for dyshidrotic eczema.  Treat with steroid ointment. No sign of infection to suggest antibiotics at this time.    Reviewed expectations regarding course of current medical issues.  All questions asked were answered.  Outlined signs and symptoms indicating need for more acute intervention. Patient verbalized understanding. After Visit Summary given.   Final Clinical Impressions(s) / UC Diagnoses   Final diagnoses:  Dyshidrotic eczema   Discharge Instructions   None    ED Prescriptions     Medication Sig Dispense Auth. Provider   triamcinolone ointment (KENALOG) 0.1 % Apply 1 Application topically 2 (two) times daily. 60 g Katha Cabal, DO      PDMP not reviewed this encounter.              Katha Cabal, DO 01/20/23 1407

## 2023-01-20 NOTE — Discharge Instructions (Addendum)
Stop at the pharmacy to pick up your steroid ointment for your eczema flare.

## 2023-03-01 ENCOUNTER — Ambulatory Visit: Payer: Medicaid Other | Admitting: Surgery

## 2023-03-12 ENCOUNTER — Encounter: Payer: Self-pay | Admitting: Advanced Practice Midwife

## 2023-03-12 ENCOUNTER — Ambulatory Visit: Payer: Medicaid Other

## 2023-03-16 ENCOUNTER — Other Ambulatory Visit: Payer: Medicaid Other

## 2023-04-07 ENCOUNTER — Ambulatory Visit (LOCAL_COMMUNITY_HEALTH_CENTER): Payer: Medicaid Other

## 2023-04-07 DIAGNOSIS — Z111 Encounter for screening for respiratory tuberculosis: Secondary | ICD-10-CM

## 2023-04-07 NOTE — Progress Notes (Signed)
In nurse clinic today for Flu vaccine and Quatiferon-TB Gold test. VIS given. Tolerated vaccine well to left delt. [facility] updated and copy given to pt. Advised pt will get a call when results return in 5-7days.

## 2023-04-08 ENCOUNTER — Encounter: Payer: Self-pay | Admitting: Advanced Practice Midwife

## 2023-04-09 ENCOUNTER — Telehealth: Payer: Self-pay

## 2023-04-09 NOTE — Telephone Encounter (Signed)
Phone call to patient. Requesting QFT gold results. RN explained that QFT results are not available and results may take up to 5 to 7 days. Explained that [facility] will contact her with results when they are ready. Results may be viewed on MyChart when results are ready. Jerel Shepherd, RN

## 2023-04-09 NOTE — Telephone Encounter (Signed)
Please give me a call I would like to get my results from my QFG test

## 2023-04-11 LAB — QUANTIFERON-TB GOLD PLUS
QuantiFERON Mitogen Value: >10 [IU]/mL
QuantiFERON Nil Value: 0 [IU]/mL
QuantiFERON TB1 Ag Value: 0 [IU]/mL
QuantiFERON TB2 Ag Value: 0 [IU]/mL
QuantiFERON-TB Gold Plus: NEGATIVE

## 2023-04-14 NOTE — Progress Notes (Signed)
Reviewed. QFT gold test negative. Phone call attempted to patient to inform her of results. No answer, message left to contact [facility] [phone])  for results and paper result copy if needed. Per Epic, appears patient has viewed results. Jerel Shepherd, RN

## 2023-07-26 ENCOUNTER — Ambulatory Visit (LOCAL_COMMUNITY_HEALTH_CENTER): Payer: Medicaid Other | Admitting: Nurse Practitioner

## 2023-07-26 ENCOUNTER — Encounter: Payer: Self-pay | Admitting: Nurse Practitioner

## 2023-07-26 VITALS — BP 120/68 | HR 75 | Ht 64.0 in | Wt 157.0 lb

## 2023-07-26 DIAGNOSIS — Z113 Encounter for screening for infections with a predominantly sexual mode of transmission: Secondary | ICD-10-CM

## 2023-07-26 DIAGNOSIS — Z01419 Encounter for gynecological examination (general) (routine) without abnormal findings: Secondary | ICD-10-CM

## 2023-07-26 DIAGNOSIS — Z3009 Encounter for other general counseling and advice on contraception: Secondary | ICD-10-CM | POA: Diagnosis not present

## 2023-07-26 LAB — WET PREP FOR TRICH, YEAST, CLUE
Trichomonas Exam: NEGATIVE
Yeast Exam: NEGATIVE

## 2023-07-26 LAB — HM HIV SCREENING LAB: HM HIV Screening: NEGATIVE

## 2023-07-26 MED ORDER — LEVONORGESTREL 1.5 MG PO TABS: 1.5000 mg | ORAL_TABLET | Freq: Once | ORAL | 0 refills | Status: AC

## 2023-07-26 NOTE — Progress Notes (Signed)
 Pt is here for PE, pap smear and STD testing.  Wet mount results reviewed, no treatment required per SO.  FP packet given.  Berdie Ogren, RN

## 2023-07-30 LAB — GONOCOCCUS CULTURE

## 2023-07-31 DIAGNOSIS — J111 Influenza due to unidentified influenza virus with other respiratory manifestations: Secondary | ICD-10-CM | POA: Insufficient documentation

## 2023-07-31 DIAGNOSIS — E861 Hypovolemia: Secondary | ICD-10-CM | POA: Insufficient documentation

## 2023-07-31 DIAGNOSIS — E876 Hypokalemia: Secondary | ICD-10-CM | POA: Insufficient documentation

## 2023-07-31 NOTE — Progress Notes (Addendum)
 [employer] [facility] DEPARTMENT [facility] [address], [address] [place] [address] Main phone: [phone]  Family Planning Visit - Repeat Yearly Visit  Subjective:  Laura Holder is a 23 y.o. G1P0  being seen today for an annual wellness visit and to discuss contraception options. The patient is currently using no method -    for pregnancy prevention. Patient does not want a pregnancy in the next year.   Patient reports they are looking for a method with the following characteristics:  Does not want to discuss contraception today.   Patient has the following medical problems:  Patient Active Problem List   Diagnosis Date Noted   Sinus tachycardia 11/02/2021   Asthma exacerbation 11/01/2021   Acute respiratory failure with hypoxia due to asthma exacerbation 11/01/2021   History of asthma 12/29/2018   Oral contraception initiation 12/29/2018    Chief Complaint  Patient presents with   Annual Exam    PE, pap smear and STD testing    HPI Patient is a 23 y.o. who presents to the clinic today for a PE with PAP and asymptomatic STI testing who reports concerns of dizziness, blurred vision, headaches, weight gain, shortness of breath and wheezing.  Shortness of Breath/Wheezing: Patient with significant asthma history. Uses Albuterol and fluticasone-salmeterol inhalers for control. States she has a PCP who she follows with regularly. States she used the last of her Fluticasone-Salmeterol inhaler today and will be contacting her PCP for a refill after this appointment. Patient reports having an asthma action plan that she understands and is familiar with. States her albuterol inhaler is within date. Reports knowing how to use appropriately and when to use.  Dizziness: Began about 1.5 years ago and occurs almost daily per patient. She states nothing makes it better or worse and cannot associate it with any trigger. Blurred Vision: She states is worst at  night and while driving. No aggravating or alleviating factors. Unknown how long has been occurring.  Weight Gain: Attributes to hormonal contraception injection (which she no longer receives). About 8lb weight gain in last 6 months.  Headaches: Associated with dizziness and vision changes. Occurs in the frontal area, and is a 5 out of 10 pain. States it is "bearable" with Ibuprofen. Unsure how long these have been occurring or how often.  Patient reports drinking 6 (16.9oz) bottles of water daily. She indicates about 10 or 11 hours of sleep nightly. 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 Patient indicates having 1 female partner, practicing vaginal and oral sex, using condoms sometimes, not using any other form of contraception currently, and history of chlamydia 2-3 years ago. Last sexual encounter 07/23/23. LMP 07/04/23.    Review of Systems  Constitutional:        Weight gain  Eyes:  Positive for blurred vision.  Respiratory:  Positive for shortness of breath and wheezing.   Neurological:  Positive for dizziness and headaches.   See flowsheet for other program required questions.   Diabetes screening This patient is 23 y.o. with a BMI of Body mass index is 26.95 kg/m.Marland Kitchen  Is patient eligible for diabetes screening (age >35 and BMI >25)?  no  Was Hgb A1c ordered? not applicable  STI screening Patient reports 1 of partners in last year.  Does this patient desire STI screening?  Yes  Hepatitis C screening Has patient been screened once for HCV in the past?  No  No results found for: "HCVAB"  Does the  patient meet criteria for HCV testing? No  (If yes-- Screen for HCV through [facility]) Criteria:  Since the last HCV result, does the patient have any of the following? - Current drug use - Have a partner with drug use - Has been incarcerated  Hepatitis B screening Does the patient meet criteria for HBV testing? No Criteria:  -Household, sexual or needle sharing contact  with HBV -History of drug use -HIV positive -Those with known Hep C  Cervical Cancer Screening  No Cervical Cancer Screening results to display.  Health Maintenance Due  Topic Date Due   Pneumococcal Vaccine 16-4 Years old (1 of 2 - PCV) 06/22/2007   Hepatitis C Screening  Never done   CHLAMYDIA SCREENING  05/21/2021   Cervical Cancer Screening (Pap smear)  Never done   COVID-19 Vaccine (1 - 2024-25 season) Never done   DTaP/Tdap/Td (2 - Td or Tdap) 02/16/2023    The following portions of the patient's history were reviewed and updated as appropriate: allergies, current medications, past family history, past medical history, past social history, past surgical history and problem list. Problem list updated.  Objective:   Vitals:   07/26/23 1419  BP: 120/68  Pulse: 75  Weight: 157 lb (71.2 kg)  Height: 5\' 4"  (1.626 m)    Physical Exam Vitals and nursing note reviewed. Chaperone present: Declined.  Constitutional:      Appearance: Normal appearance.  HENT:     Head: Normocephalic.     Salivary Glands: Right salivary gland is not diffusely enlarged or tender. Left salivary gland is not diffusely enlarged or tender.     Mouth/Throat:     Lips: Pink. No lesions.     Mouth: Mucous membranes are moist.     Tongue: No lesions. Tongue does not deviate from midline.     Pharynx: Oropharynx is clear. Uvula midline. No oropharyngeal exudate or posterior oropharyngeal erythema.     Tonsils: No tonsillar exudate.  Eyes:     General:        Right eye: No discharge.        Left eye: No discharge.  Neck:     Thyroid: No thyroid mass or thyroid tenderness.     Trachea: Trachea and phonation normal. No tracheal tenderness or tracheal deviation.  Cardiovascular:     Rate and Rhythm: Normal rate and regular rhythm.     Heart sounds: Normal heart sounds, S1 normal and S2 normal.  Pulmonary:     Effort: Pulmonary effort is normal.     Breath sounds: Examination of the right-middle  field reveals wheezing. Examination of the left-middle field reveals wheezing. Wheezing present.     Comments: Inspiratory and expiratory wheezing present bilaterally in mid lobe area.  Patient not in acute distress.  Chest:     Comments: No CBE performed. No concerns and not based on age due per ACOG guidelines.  Abdominal:     General: Abdomen is flat. Bowel sounds are normal.     Palpations: Abdomen is soft.  Genitourinary:    General: Normal vulva.     Exam position: Lithotomy position.     Pubic Area: No rash or pubic lice.      Tanner stage (genital): 5.     Labia:        Right: No rash, tenderness, lesion or injury.        Left: No rash, tenderness, lesion or injury.      Vagina: Normal. No signs of injury  and foreign body. No vaginal discharge, erythema, tenderness, bleeding or lesions.     Cervix: Normal. No cervical motion tenderness, discharge, friability, lesion, erythema, cervical bleeding or eversion.     Uterus: Normal.      Adnexa: Right adnexa normal and left adnexa normal.     Comments: pH<4.5 Lymphadenopathy:     Head:     Right side of head: No submental, submandibular, tonsillar, preauricular or posterior auricular adenopathy.     Left side of head: No submental, submandibular, tonsillar, preauricular or posterior auricular adenopathy.     Cervical: No cervical adenopathy.     Right cervical: No superficial or posterior cervical adenopathy.    Left cervical: No superficial or posterior cervical adenopathy.     Upper Body:     Right upper body: No supraclavicular or axillary adenopathy.     Left upper body: No supraclavicular or axillary adenopathy.     Lower Body: No right inguinal adenopathy. No left inguinal adenopathy.  Skin:    General: Skin is warm and dry.  Neurological:     Mental Status: She is alert and oriented to person, place, and time.  Psychiatric:        Attention and Perception: Attention and perception normal.        Mood and Affect: Mood  and affect normal.        Speech: Speech normal.        Behavior: Behavior normal. Behavior is cooperative.        Thought Content: Thought content normal.    Assessment and Plan:  Shalayne A Mettler is a 23 y.o. female G1P0 presenting to the [facility] Department for an yearly wellness and contraception visit  1. Family planning (Primary) Contraception counseling: Reviewed options based on patient desire and reproductive life plan. Patient is interested in No Method - No Contraceptive Precautions.   Risks, benefits, and typical effectiveness rates were reviewed.  Questions were answered.  Written information was also given to the patient to review.    The patient will follow up in  1 years for surveillance.  The patient was told to call with any further questions, or with any concerns about this method of contraception.  Emphasized use of condoms 100% of the time for STI prevention.  Educated on ECP and assessed need for ECP. Patient was offered ECP based on Unprotected sex within past 72 hours.  Patient is within 3 days of unprotected sex. Patient was offered ECP. Reviewed options and patient desired Plan B (levonorgestrel)   - levonorgestrel (PLAN B ONE-STEP) 1.5 MG tablet; Take 1 tablet (1.5 mg total) by mouth once for 1 dose.  Dispense: 1 tablet; Refill: 0  2. Well woman exam Strongly encouraged patient to reach out to PCP like she plans to request inhaler refill. Educated on signs and symptoms that would require 911 and/or emergent care including but not limited to inability to catch breath, chest pain, somnolence, cyanosis. Patient verbalized understanding and agreed with plan.  PAP performed today. Repeat timing dependent on results.  CBE not due per ACOG age guidelines. Will begin every 1-3 years at age 25.  Patient has a PCP. Encouraged to follow-up regarding asthma symptoms, headaches, dizziness, and blurred vision. Encouraged patient to request a vision exam.   Encouraged  patient to continue drinking water daily and getting adequate hours of sleep.  Provided patient with some stress relieving activities to try including walking as a means of exercise.   3. Screening for  venereal disease - Chlamydia/Gonorrhea [facility] - HIV [place] [facility] - Syphilis Serology, [place] [facility] - IGP, rfx Aptima HPV ASCU - WET PREP FOR TRICH, YEAST, CLUE - Gonococcus culture  Return in about 1 year (around 07/25/2024) for annual well-woman exam.  No future appointments.  Edmonia James, NP

## 2023-08-01 LAB — IGP, RFX APTIMA HPV ASCU: PAP Smear Comment: 0

## 2023-08-01 LAB — SPECIMEN STATUS REPORT

## 2023-08-05 ENCOUNTER — Encounter: Payer: Self-pay | Admitting: Nurse Practitioner

## 2023-08-05 NOTE — Progress Notes (Signed)
 NILM, HPV criteria not met. Repeat pap test in 3 years per ASCCP guidelines. Please send letter to patient. Thank you, Marylu Lund L. Mairen Wallenstein, FNP-C

## 2023-10-07 ENCOUNTER — Ambulatory Visit: Admitting: Certified Nurse Midwife

## 2023-10-12 ENCOUNTER — Encounter: Payer: Self-pay | Admitting: Certified Nurse Midwife

## 2023-11-09 ENCOUNTER — Ambulatory Visit (INDEPENDENT_AMBULATORY_CARE_PROVIDER_SITE_OTHER): Admitting: Obstetrics & Gynecology

## 2023-11-09 VITALS — BP 130/87 | HR 75 | Ht 64.0 in | Wt 148.8 lb

## 2023-11-09 DIAGNOSIS — N914 Secondary oligomenorrhea: Secondary | ICD-10-CM

## 2023-11-09 DIAGNOSIS — N926 Irregular menstruation, unspecified: Secondary | ICD-10-CM

## 2023-11-09 DIAGNOSIS — Z3202 Encounter for pregnancy test, result negative: Secondary | ICD-10-CM

## 2023-11-09 LAB — POCT URINE PREGNANCY: Preg Test, Ur: NEGATIVE

## 2023-11-09 MED ORDER — MEDROXYPROGESTERONE ACETATE 10 MG PO TABS: ORAL_TABLET | ORAL | 2 refills | Status: DC

## 2023-11-09 NOTE — Progress Notes (Signed)
    GYNECOLOGY PROGRESS NOTE  Subjective:    Patient ID: Laura Holder, female    DOB: 10/14/2000, 23 y.o.   MRN: [id number]  HPI  Patient is a 23 y.o. G1P0e1 here for the first time since 2022 when she was diagnosed with a "possible right ectopic" . She was treat with methotrexate  at that time and did follow up [facility] until the level of 18.  She tells me that since then she has only had 2 periods per year for the last 3 years. She uses withdrawal for contraception. She has been monogamous for 3 years. She denies excess hair growth. She reports a normal TSH at the health dept this year. She does report some weight gain up to 160 pounds last year but then lost back down to 148 this year.  She reports menarche at age 21 and had monthly 5 day periods until the ectopic.   The following portions of the patient's history were reviewed and updated as appropriate: allergies, current medications, past family history, past medical history, past social history, past surgical history, and problem list.  Review of Systems Pertinent items are noted in HPI.  She had a pap smear 07/26/2023 that was normal. She takes folic acid daily. She is a Lawyer in [place].  Objective:   Blood pressure 130/87, pulse 75, height 5\' 4"  (1.626 m), weight 148 lb 12.8 oz (67.5 kg). Body mass index is 25.54 kg/m. Well nourished, well hydrated  female, no apparent distress She is ambulating and conversing normally.    Assessment:   1. Irregular periods   2.     Oligomenorrhea  Plan:   - POCT urine pregnancy - check FSH/LH, prolactin - Provera challenge x 10 days for the next 3 months  She will find out from her mom if she has had Gardasil series.  Follow up in 4 months

## 2023-11-10 LAB — PROLACTIN: Prolactin: 14.5 ng/mL (ref 4.8–33.4)

## 2023-11-10 LAB — FSH/LH
FSH: 4.7 m[IU]/mL
LH: 23.5 m[IU]/mL

## 2023-11-15 ENCOUNTER — Encounter: Payer: Self-pay | Admitting: Obstetrics & Gynecology

## 2023-11-18 ENCOUNTER — Ambulatory Visit (INDEPENDENT_AMBULATORY_CARE_PROVIDER_SITE_OTHER): Admitting: Obstetrics

## 2023-11-18 ENCOUNTER — Encounter: Payer: Self-pay | Admitting: Obstetrics

## 2023-11-18 VITALS — BP 138/89 | HR 106 | Ht 70.0 in | Wt 148.0 lb

## 2023-11-18 DIAGNOSIS — N914 Secondary oligomenorrhea: Secondary | ICD-10-CM

## 2023-11-18 DIAGNOSIS — Z3202 Encounter for pregnancy test, result negative: Secondary | ICD-10-CM

## 2023-11-18 DIAGNOSIS — N912 Amenorrhea, unspecified: Secondary | ICD-10-CM

## 2023-11-18 LAB — POCT URINE PREGNANCY: Preg Test, Ur: NEGATIVE

## 2023-11-18 MED ORDER — ONDANSETRON 4 MG PO TBDP
4.0000 mg | ORAL_TABLET | Freq: Three times a day (TID) | ORAL | 0 refills | Status: AC | PRN
Start: 2023-11-18 — End: ?

## 2023-11-18 NOTE — Progress Notes (Signed)
   GYN ENCOUNTER  Subjective  HPI: Laura Holder is a 23 y.o. G1P0 who presents today for f/u for oligomenorrhea. She has not had a period since January. She has a h/o treatment of ectopic pregnancy with methotrexate  in 2022, and since then, she has only had 2 periods a year. Before then, she had regular monthly periods. She denies hirsutism, acne, and difficulty losing weight. She had a normal prolactin level recently. She was prescribed Provera  to try to induce a period, but she was only able to tolerate it for a few days and did not finish the course.   Past Medical History:  Diagnosis Date   Asthma    Past Surgical History:  Procedure Laterality Date   Dental procedure     Wisdom teeth extraction   OB History     Gravida  1   Para      Term      Preterm      AB      Living  0      SAB      IAB      Ectopic      Multiple      Live Births             Allergies  Allergen Reactions   Shellfish Allergy Swelling   Bee Pollen Swelling    ROS: See HPI  Objective  BP 138/89 (BP Location: Right Arm, Patient Position: Sitting, Cuff Size: Normal)   Pulse (!) 106   Ht 5' 10 (1.778 m)   Wt 148 lb (67.1 kg)   LMP 06/28/2023 (Approximate)   BMI 21.24 kg/m   Physical examination Declined  Assessment Oligomenorrhea Possible PCOS  Plan -Discussed COCs or trying progesterone challenge again. Laura Holder would like to try the Provera  again. Rx sent for Zofran , will try taking Provera  at night with Zofran  to manage nausea. Pelvic US  ordered. F/u in 3 months.   Laura Holder, CNM

## 2023-11-21 LAB — TESTOSTERONE,FREE AND TOTAL
Testosterone, Free: 6.9 pg/mL — ABNORMAL HIGH (ref 0.0–4.2)
Testosterone: 87 ng/dL — ABNORMAL HIGH (ref 13–71)

## 2023-11-21 LAB — HEMOGLOBIN A1C
Est. average glucose Bld gHb Est-mCnc: 111 mg/dL
Hgb A1c MFr Bld: 5.5 % (ref 4.8–5.6)

## 2023-11-21 LAB — FSH/LH
FSH: 3.6 m[IU]/mL
LH: 15.3 m[IU]/mL

## 2023-11-21 LAB — DHEA-SULFATE: DHEA-SO4: 414 ug/dL (ref 110.0–431.7)

## 2023-11-21 LAB — ESTRADIOL: Estradiol: 63.6 pg/mL

## 2023-11-23 ENCOUNTER — Ambulatory Visit: Payer: Self-pay | Admitting: Obstetrics

## 2023-11-30 ENCOUNTER — Other Ambulatory Visit

## 2023-12-14 ENCOUNTER — Telehealth: Payer: Self-pay | Admitting: Obstetrics

## 2023-12-14 ENCOUNTER — Other Ambulatory Visit

## 2023-12-14 NOTE — Telephone Encounter (Signed)
 Reached out to pt to reschedule gyn US  that was scheduled on 12/14/2023 at 2:00 per EMERSON Canny.  Was able to reschedule the appt to 12/22/2023 at 2:00.

## 2023-12-22 ENCOUNTER — Other Ambulatory Visit

## 2024-03-14 ENCOUNTER — Telehealth: Payer: Self-pay

## 2024-03-28 NOTE — Telephone Encounter (Signed)
 Contacted the patient she is scheduled 1/6 for ultrasound and follow up on 11/19 with Melissa Swanson.

## 2024-04-05 ENCOUNTER — Other Ambulatory Visit: Payer: Self-pay | Admitting: Obstetrics

## 2024-04-05 DIAGNOSIS — N914 Secondary oligomenorrhea: Secondary | ICD-10-CM

## 2024-04-05 DIAGNOSIS — N912 Amenorrhea, unspecified: Secondary | ICD-10-CM

## 2024-04-06 ENCOUNTER — Other Ambulatory Visit

## 2024-04-06 DIAGNOSIS — N914 Secondary oligomenorrhea: Secondary | ICD-10-CM | POA: Diagnosis not present

## 2024-04-19 ENCOUNTER — Ambulatory Visit: Admitting: Obstetrics

## 2024-04-19 ENCOUNTER — Encounter: Payer: Self-pay | Admitting: Obstetrics

## 2024-04-19 VITALS — BP 110/72 | HR 99 | Ht 64.0 in | Wt 157.6 lb

## 2024-04-19 DIAGNOSIS — N979 Female infertility, unspecified: Secondary | ICD-10-CM

## 2024-04-19 DIAGNOSIS — N970 Female infertility associated with anovulation: Secondary | ICD-10-CM

## 2024-04-19 DIAGNOSIS — E282 Polycystic ovarian syndrome: Secondary | ICD-10-CM | POA: Insufficient documentation

## 2024-04-19 DIAGNOSIS — N915 Oligomenorrhea, unspecified: Secondary | ICD-10-CM

## 2024-04-19 MED ORDER — MEDROXYPROGESTERONE ACETATE 10 MG PO TABS: ORAL_TABLET | ORAL | 2 refills | Status: DC

## 2024-04-19 NOTE — Progress Notes (Signed)
   GYN ENCOUNTER  Subjective  HPI: Laura Holder is a 23 y.o. G1P0010 who presents today for f/u after her US  and labs. She has a h/o oligomenorrhea after an ectopic pregnancy. Labs and US  confirm the diagnosis of PCOS. She would like to get pregnant. She has been tracking her cycles and they are quite irregular. She does notice fertile cervical mucus about 2 weeks before she menstruates. She did menstruate after a Provera  challenge and then has cycled irregularly.  Past Medical History:  Diagnosis Date   Asthma    Past Surgical History:  Procedure Laterality Date   Dental procedure     Wisdom teeth extraction   OB History     Gravida  1   Para      Term      Preterm      AB  1   Living  0      SAB      IAB      Ectopic  1   Multiple      Live Births             Allergies  Allergen Reactions   Shellfish Allergy Swelling   Bee Pollen Swelling    ROS: See HPI   Objective  BP 110/72   Pulse 99   Ht 5' 4 (1.626 m)   Wt 157 lb 9.6 oz (71.5 kg)   LMP 03/10/2024 (Exact Date)   BMI 27.05 kg/m   Physical examination General: alert, cooperative, NAD Pelvic: Declined   Assessment PCOS with oligomenorrhea Infertility  Plan -Discussed options of 3 regulating cycles with 3 months of COCs, using progesterone to stimulate a menstrual cycle. She is interested in an HSG given her h/o ectopic, but there is not currently a provider in this office who performs that procedure. She would like a referral to REI. -Will use 10 days of Provera  monthly and continue to track cycles and time intercourse while awaiting REI appt   Eleanor Canny, CNM

## 2024-05-17 ENCOUNTER — Telehealth: Payer: Self-pay

## 2024-05-17 NOTE — Telephone Encounter (Signed)
 Laura Holder called triage and left voicemail but it was going in an out so I called her back and left voicemail.

## 2024-06-14 ENCOUNTER — Ambulatory Visit

## 2024-06-14 ENCOUNTER — Ambulatory Visit: Payer: Self-pay | Admitting: Family Medicine

## 2024-06-14 VITALS — BP 129/81 | Ht 66.0 in | Wt 155.5 lb

## 2024-06-14 DIAGNOSIS — Z3201 Encounter for pregnancy test, result positive: Secondary | ICD-10-CM | POA: Diagnosis not present

## 2024-06-14 DIAGNOSIS — Z309 Encounter for contraceptive management, unspecified: Secondary | ICD-10-CM | POA: Diagnosis not present

## 2024-06-14 LAB — PREGNANCY, URINE: Preg Test, Ur: POSITIVE — AB

## 2024-06-14 MED ORDER — PRENATAL 27-0.8 MG PO TABS: 1.0000 | ORAL_TABLET | Freq: Every day | ORAL | Status: AC

## 2024-06-14 NOTE — Progress Notes (Signed)
 UPT positive. Plans on prenatal care at [facility]. Pt has appt scheduled for 06/15/24. Pt stated she went to ED yesterday for bleeding .Had UPT ,US  and bloodwork. But she had this appt and wanted to come in for UPT. Stated she had taken a UPT at home 2 days agp and was positive.Sent to clerical for presumptive eligibility. Pt states she has history of ectopic pregnancy in 2021. Also states she has PCOS. Positive pregnancy packet given to patient and information reviewed.PNV given to pt today.  The patient was dispensed PNV#100 today. I provided counseling today regarding the medication. We discussed the medication, the side effects and when to call clinic. Patient given the opportunity to ask questions. Questions answered.

## 2024-06-24 ENCOUNTER — Emergency Department
Admission: EM | Admit: 2024-06-24 | Discharge: 2024-06-25 | Disposition: A | Attending: Emergency Medicine | Admitting: Emergency Medicine

## 2024-06-24 ENCOUNTER — Other Ambulatory Visit: Payer: Self-pay

## 2024-06-24 DIAGNOSIS — R0602 Shortness of breath: Secondary | ICD-10-CM | POA: Diagnosis present

## 2024-06-24 DIAGNOSIS — J4521 Mild intermittent asthma with (acute) exacerbation: Secondary | ICD-10-CM | POA: Insufficient documentation

## 2024-06-24 MED ORDER — PREDNISONE 10 MG PO TABS: ORAL_TABLET | ORAL | 0 refills | Status: AC

## 2024-06-24 MED ORDER — ADVAIR DISKUS 250-50 MCG/ACT IN AEPB: 1.0000 | INHALATION_SPRAY | Freq: Two times a day (BID) | RESPIRATORY_TRACT | 1 refills | Status: AC

## 2024-06-24 MED ORDER — IPRATROPIUM-ALBUTEROL 0.5-2.5 (3) MG/3ML IN SOLN
3.0000 mL | Freq: Once | RESPIRATORY_TRACT | Status: AC
  Administered 2024-06-24: 3 mL via RESPIRATORY_TRACT
  Filled 2024-06-24: qty 3

## 2024-06-24 MED ORDER — MONTELUKAST SODIUM 10 MG PO TABS: 10.0000 mg | ORAL_TABLET | Freq: Every day | ORAL | 2 refills | Status: AC

## 2024-06-24 MED ORDER — ALBUTEROL SULFATE HFA 108 (90 BASE) MCG/ACT IN AERS: INHALATION_SPRAY | RESPIRATORY_TRACT | 1 refills | Status: AC

## 2024-06-24 NOTE — Discharge Instructions (Signed)
 We believe that your symptoms are caused today by an exacerbation of your asthma.  Please take the prescribed medications and any medications that you have at home.  Follow up with your doctor as recommended.  If you develop any new or worsening symptoms, including but not limited to fever, persistent vomiting, worsening shortness of breath, or other symptoms that concern you, please return to the Emergency Department immediately.

## 2024-06-24 NOTE — ED Provider Notes (Signed)
 "  [facility] Provider Note    Event Date/Time   First MD Initiated Contact with Patient 06/24/24 2305     (approximate)   History   Shortness of Breath   HPI Laura Holder is a 24 y.o. female with a history of asthma who presents for asthma exacerbation.  She reports that for the last couple days she has been having more trouble with wheezing and shortness of breath.  Cold is a trigger and we are having winter weather come through as of tonight.  She said she was doing okay but then she and her partner went outside to see if they can go sliding and she became very short of breath with some chest discomfort and increased wheezing.  EMS provided 2 DuoNeb's and an albuterol  treatment.  They tried to give her 2 g of magnesium  but she said it makes her feel like she needs to pee so she asked them to stop it.     Physical Exam   Triage Vital Signs: ED Triage Vitals  Encounter Vitals Group     BP 06/24/24 2257 131/81     Girls Systolic BP Percentile --      Girls Diastolic BP Percentile --      Boys Systolic BP Percentile --      Boys Diastolic BP Percentile --      Pulse Rate 06/24/24 2257 (!) 143     Resp 06/24/24 2257 (!) 22     Temp 06/24/24 2257 99.5 F (37.5 C)     Temp Source 06/24/24 2257 Oral     SpO2 06/24/24 2257 99 %     Weight 06/24/24 2301 70.3 kg (155 lb)     Height 06/24/24 2301 1.676 m (5' 6)     Head Circumference --      Peak Flow --      Pain Score 06/24/24 2259 10     Pain Loc --      Pain Education --      Exclude from Growth Chart --     Most recent vital signs: Vitals:   06/24/24 2257  BP: 131/81  Pulse: (!) 143  Resp: (!) 22  Temp: 99.5 F (37.5 C)  SpO2: 99%    General: Awake, alert, pleasant and conversant.  No obvious distress upon initial evaluation. CV:  Good peripheral perfusion.  Tachycardia after albuterol .  Normal heart sounds. Resp:  Normal effort but with mild tachypnea. Speaking easily and  comfortably, no accessory muscle usage nor intercostal retractions.  However she does have end expiratory wheezing throughout lung fields. Abd:  No distention.    ED Results / Procedures / Treatments   Labs (all labs ordered are listed, but only abnormal results are displayed) Labs Reviewed  RESP PANEL BY RT-PCR (RSV, FLU A&B, COVID)  [id number]     EKG  ED ECG REPORT I, Darleene Dome, the attending physician, personally viewed and interpreted this ECG.  Date: 06/24/2024 EKG Time: 23: 01 Rate: 140 Rhythm: Sinus tachycardia QRS Axis: Borderline right axis deviation Intervals: normal ST/T Wave abnormalities: normal Narrative Interpretation: no evidence of acute ischemia   PROCEDURES:  Critical Care performed: No  Procedures    IMPRESSION / MDM / ASSESSMENT AND PLAN / ED COURSE  I reviewed the triage vital signs and the nursing notes.  Differential diagnosis includes, but is not limited to, asthma exacerbation, viral illness, pneumonia  Patient's presentation is most consistent with acute presentation with potential threat to life or bodily function.  Labs/studies ordered (see ED course for additional labs and studies that may have been added later): Respiratory viral panel  Interventions/Medications given:  Medications  ipratropium-albuterol  (DUONEB) 0.5-2.5 (3) MG/3ML nebulizer solution 3 mL (3 mLs Nebulization Given 06/24/24 2344)    (Note:  hospital course my include additional interventions and/or labs/studies not listed above.)   Patient already received treatment and is feeling much better.  She is a little bit nervous about going back out in the weather and is still wheezing so I am giving her another DuoNeb while the steroids continue to work.  I considered giving her a dose of Decadron 10 mg IV so that it would last for longer and so she does not have to go to the pharmacy during the winter storm, but the online evidence I could  find indicates that this may not be the best approach given that the patient is [redacted] weeks pregnant and though the risk is low, there is more evidence to suggest negative outcomes in pregnancy with the use of IV dexamethasone.  Patient is comfortable with the plan for discharge and has albuterol  at home although I also sent refills for her.  I gave my usual and customary return precautions.  The patient's medical screening exam is reassuring with no indication of an emergent medical condition requiring hospitalization or additional evaluation at this point.  The patient is safe and appropriate for discharge and outpatient follow up.      FINAL CLINICAL IMPRESSION(S) / ED DIAGNOSES   Final diagnoses:  Mild intermittent asthma with exacerbation     Rx / DC Orders   ED Discharge Orders          Ordered    predniSONE  (DELTASONE ) 10 MG tablet        06/24/24 2348    fluticasone -salmeterol (ADVAIR  DISKUS) 250-50 MCG/ACT AEPB  2 times daily        06/24/24 2355    albuterol  (VENTOLIN  HFA) 108 (90 Base) MCG/ACT inhaler       Note to Pharmacy: Pharmacy may substitute brand and size for insurance-approved equivalent   06/24/24 2355    montelukast  (SINGULAIR ) 10 MG tablet  Daily at bedtime        06/24/24 2355             Note:  This document was prepared using Dragon voice recognition software and may include unintentional dictation errors.   Gordan Huxley, MD 06/24/24 2356  "

## 2024-06-24 NOTE — ED Triage Notes (Signed)
 Pt from home via [facility] for asthma attack. Pt stated this this started three days ago but got worse tonight when pt walked outside.   Pt was given 1 albuterol , 2 duo nebs, 125 solumedrol, EMS hung 2g of mag but stopped it immediately as pt stated it was making her feel flushed and pt wanted it stopped.   Pt is [redacted] weeks pregnant and this is her first pregnancy.

## 2024-06-25 LAB — RESP PANEL BY RT-PCR (RSV, FLU A&B, COVID)  RVPGX2
Influenza A by PCR: NEGATIVE
Influenza B by PCR: NEGATIVE
Resp Syncytial Virus by PCR: NEGATIVE
SARS Coronavirus 2 by RT PCR: NEGATIVE

## 2024-06-25 NOTE — ED Notes (Signed)
 Pt given discharge paperwork. Pt declined discharge vitals.

## 2024-06-27 ENCOUNTER — Ambulatory Visit: Admitting: Registered Nurse
